# Patient Record
Sex: Female | Born: 1985 | Race: White | Hispanic: No | Marital: Single | Smoking: Current every day smoker
Health system: Southern US, Community
[De-identification: ages and names within clinical notes are randomized; demographics above are authoritative.]

## PROBLEM LIST (undated history)

## (undated) ENCOUNTER — Inpatient Hospital Stay (HOSPITAL_COMMUNITY): Payer: Self-pay

## (undated) DIAGNOSIS — N926 Irregular menstruation, unspecified: Secondary | ICD-10-CM

## (undated) DIAGNOSIS — Z8619 Personal history of other infectious and parasitic diseases: Secondary | ICD-10-CM

## (undated) DIAGNOSIS — F909 Attention-deficit hyperactivity disorder, unspecified type: Secondary | ICD-10-CM

## (undated) DIAGNOSIS — Z973 Presence of spectacles and contact lenses: Secondary | ICD-10-CM

## (undated) DIAGNOSIS — M519 Unspecified thoracic, thoracolumbar and lumbosacral intervertebral disc disorder: Secondary | ICD-10-CM

## (undated) HISTORY — PX: TUBAL LIGATION: SHX77

## (undated) HISTORY — DX: Personal history of other infectious and parasitic diseases: Z86.19

## (undated) HISTORY — DX: Unspecified thoracic, thoracolumbar and lumbosacral intervertebral disc disorder: M51.9

## (undated) HISTORY — PX: BREAST SURGERY: SHX581

---
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
## 2007-01-06 ENCOUNTER — Other Ambulatory Visit: Admission: RE | Admit: 2007-01-06 | Discharge: 2007-01-06 | Payer: Self-pay | Admitting: Obstetrics and Gynecology

## 2007-01-22 ENCOUNTER — Other Ambulatory Visit: Payer: Self-pay | Admitting: Emergency Medicine

## 2007-01-22 ENCOUNTER — Inpatient Hospital Stay (HOSPITAL_COMMUNITY): Admission: AD | Admit: 2007-01-22 | Discharge: 2007-01-24 | Payer: Self-pay | Admitting: *Deleted

## 2007-01-22 ENCOUNTER — Ambulatory Visit: Payer: Self-pay | Admitting: *Deleted

## 2007-06-27 ENCOUNTER — Emergency Department (HOSPITAL_COMMUNITY): Admission: EM | Admit: 2007-06-27 | Discharge: 2007-06-27 | Payer: Self-pay | Admitting: Emergency Medicine

## 2007-07-06 ENCOUNTER — Emergency Department (HOSPITAL_COMMUNITY): Admission: EM | Admit: 2007-07-06 | Discharge: 2007-07-06 | Payer: Self-pay | Admitting: Emergency Medicine

## 2007-07-13 ENCOUNTER — Inpatient Hospital Stay (HOSPITAL_COMMUNITY): Admission: AD | Admit: 2007-07-13 | Discharge: 2007-07-13 | Payer: Self-pay | Admitting: Obstetrics & Gynecology

## 2007-08-03 ENCOUNTER — Ambulatory Visit: Payer: Self-pay | Admitting: Obstetrics and Gynecology

## 2007-09-20 ENCOUNTER — Emergency Department (HOSPITAL_COMMUNITY): Admission: EM | Admit: 2007-09-20 | Discharge: 2007-09-20 | Payer: Self-pay | Admitting: Emergency Medicine

## 2007-10-09 ENCOUNTER — Emergency Department (HOSPITAL_COMMUNITY): Admission: EM | Admit: 2007-10-09 | Discharge: 2007-10-10 | Payer: Self-pay | Admitting: Emergency Medicine

## 2007-10-17 ENCOUNTER — Inpatient Hospital Stay (HOSPITAL_COMMUNITY): Admission: AD | Admit: 2007-10-17 | Discharge: 2007-10-17 | Payer: Self-pay | Admitting: Family Medicine

## 2007-10-22 ENCOUNTER — Emergency Department (HOSPITAL_COMMUNITY): Admission: EM | Admit: 2007-10-22 | Discharge: 2007-10-23 | Payer: Self-pay | Admitting: Emergency Medicine

## 2007-11-17 ENCOUNTER — Ambulatory Visit (HOSPITAL_COMMUNITY): Admission: RE | Admit: 2007-11-17 | Discharge: 2007-11-17 | Payer: Self-pay | Admitting: General Surgery

## 2007-11-17 ENCOUNTER — Encounter (INDEPENDENT_AMBULATORY_CARE_PROVIDER_SITE_OTHER): Payer: Self-pay | Admitting: General Surgery

## 2007-11-17 HISTORY — PX: CHOLECYSTECTOMY, LAPAROSCOPIC: SHX56

## 2008-04-12 HISTORY — PX: CHOLECYSTECTOMY: SHX55

## 2008-06-11 ENCOUNTER — Inpatient Hospital Stay (HOSPITAL_COMMUNITY): Admission: AD | Admit: 2008-06-11 | Discharge: 2008-06-11 | Payer: Self-pay | Admitting: Obstetrics and Gynecology

## 2008-07-29 ENCOUNTER — Inpatient Hospital Stay (HOSPITAL_COMMUNITY): Admission: AD | Admit: 2008-07-29 | Discharge: 2008-07-31 | Payer: Self-pay | Admitting: Obstetrics and Gynecology

## 2008-12-06 ENCOUNTER — Emergency Department (HOSPITAL_COMMUNITY): Admission: EM | Admit: 2008-12-06 | Discharge: 2008-12-06 | Payer: Self-pay | Admitting: Emergency Medicine

## 2008-12-20 ENCOUNTER — Inpatient Hospital Stay (HOSPITAL_COMMUNITY): Admission: AD | Admit: 2008-12-20 | Discharge: 2008-12-20 | Payer: Self-pay | Admitting: Family Medicine

## 2009-05-20 ENCOUNTER — Emergency Department (HOSPITAL_COMMUNITY): Admission: EM | Admit: 2009-05-20 | Discharge: 2009-05-21 | Payer: Self-pay | Admitting: Emergency Medicine

## 2009-11-17 ENCOUNTER — Emergency Department (HOSPITAL_COMMUNITY): Admission: EM | Admit: 2009-11-17 | Discharge: 2009-11-17 | Payer: Self-pay | Admitting: Emergency Medicine

## 2010-03-15 ENCOUNTER — Inpatient Hospital Stay (HOSPITAL_COMMUNITY)
Admission: AD | Admit: 2010-03-15 | Discharge: 2010-03-15 | Payer: Self-pay | Source: Home / Self Care | Admitting: Obstetrics & Gynecology

## 2010-06-22 LAB — URINE MICROSCOPIC-ADD ON

## 2010-06-22 LAB — URINALYSIS, ROUTINE W REFLEX MICROSCOPIC
Bilirubin Urine: NEGATIVE
Glucose, UA: NEGATIVE mg/dL
Ketones, ur: NEGATIVE mg/dL
Leukocytes, UA: NEGATIVE
Nitrite: NEGATIVE
Protein, ur: NEGATIVE mg/dL
Specific Gravity, Urine: 1.02 (ref 1.005–1.030)
Urobilinogen, UA: 0.2 mg/dL (ref 0.0–1.0)
pH: 6 (ref 5.0–8.0)

## 2010-06-22 LAB — POCT PREGNANCY, URINE: Preg Test, Ur: NEGATIVE

## 2010-06-26 LAB — URINALYSIS, ROUTINE W REFLEX MICROSCOPIC
Bilirubin Urine: NEGATIVE
Glucose, UA: NEGATIVE mg/dL
Hgb urine dipstick: NEGATIVE
Ketones, ur: NEGATIVE mg/dL
Nitrite: NEGATIVE
Protein, ur: NEGATIVE mg/dL
Specific Gravity, Urine: 1.01 (ref 1.005–1.030)
Urobilinogen, UA: 0.2 mg/dL (ref 0.0–1.0)
pH: 6.5 (ref 5.0–8.0)

## 2010-07-01 LAB — URINALYSIS, ROUTINE W REFLEX MICROSCOPIC
Bilirubin Urine: NEGATIVE
Glucose, UA: NEGATIVE mg/dL
Hgb urine dipstick: NEGATIVE
Ketones, ur: NEGATIVE mg/dL
Nitrite: NEGATIVE
Protein, ur: NEGATIVE mg/dL
Specific Gravity, Urine: 1.01 (ref 1.005–1.030)
Urobilinogen, UA: 0.2 mg/dL (ref 0.0–1.0)
pH: 6 (ref 5.0–8.0)

## 2010-07-01 LAB — CBC
HCT: 37.7 % (ref 36.0–46.0)
Hemoglobin: 13 g/dL (ref 12.0–15.0)
WBC: 6.3 10*3/uL (ref 4.0–10.5)

## 2010-07-01 LAB — BASIC METABOLIC PANEL
GFR calc non Af Amer: >60 mL/min (ref >60)
Glucose, Bld: 111 mg/dL — ABNORMAL HIGH (ref 70–99)
Potassium: 3.3 mEq/L — ABNORMAL LOW (ref 3.5–5.1)
Sodium: 131 mEq/L — ABNORMAL LOW (ref 135–145)

## 2010-07-01 LAB — POCT PREGNANCY, URINE: Preg Test, Ur: NEGATIVE

## 2010-07-01 LAB — DIFFERENTIAL
Eosinophils Relative: 1 % (ref 0–5)
Lymphocytes Relative: 7 % — ABNORMAL LOW (ref 12–46)
Lymphs Abs: 0.5 10*3/uL — ABNORMAL LOW (ref 0.7–4.0)
Monocytes Absolute: 0.4 10*3/uL (ref 0.1–1.0)
Monocytes Relative: 7 % (ref 3–12)

## 2010-07-17 LAB — HERPES SIMPLEX VIRUS CULTURE: Culture: NOT DETECTED

## 2010-07-17 LAB — URINALYSIS, ROUTINE W REFLEX MICROSCOPIC
Bilirubin Urine: NEGATIVE
Glucose, UA: NEGATIVE mg/dL
Ketones, ur: NEGATIVE mg/dL
pH: 7 (ref 5.0–8.0)

## 2010-07-17 LAB — WET PREP, GENITAL: Yeast Wet Prep HPF POC: NONE SEEN

## 2010-07-17 LAB — URINE MICROSCOPIC-ADD ON

## 2010-07-17 LAB — GC/CHLAMYDIA PROBE AMP, GENITAL: Chlamydia, DNA Probe: NEGATIVE

## 2010-07-22 ENCOUNTER — Inpatient Hospital Stay (HOSPITAL_COMMUNITY)
Admission: AD | Admit: 2010-07-22 | Discharge: 2010-07-22 | Disposition: A | Discharge status: Home / Self Care | Payer: Medicaid Other | Source: Ambulatory Visit | Attending: Obstetrics & Gynecology | Admitting: Obstetrics & Gynecology

## 2010-07-22 DIAGNOSIS — O209 Hemorrhage in early pregnancy, unspecified: Secondary | ICD-10-CM | POA: Insufficient documentation

## 2010-07-22 LAB — CBC
HCT: 34.5 % — ABNORMAL LOW (ref 36.0–46.0)
MCHC: 33.9 g/dL (ref 30.0–36.0)
MCHC: 34.3 g/dL (ref 30.0–36.0)
MCV: 92.5 fL (ref 78.0–100.0)
Platelets: 297 10*3/uL (ref 150–400)
Platelets: 322 10*3/uL (ref 150–400)
RDW: 13.7 % (ref 11.5–15.5)
RDW: 13.8 % (ref 11.5–15.5)
WBC: 13.6 10*3/uL — ABNORMAL HIGH (ref 4.0–10.5)

## 2010-07-22 LAB — WET PREP, GENITAL
Clue Cells Wet Prep HPF POC: NONE SEEN
Clue Cells Wet Prep HPF POC: NONE SEEN
Trich, Wet Prep: NONE SEEN
Yeast Wet Prep HPF POC: NONE SEEN

## 2010-07-22 LAB — URINALYSIS, ROUTINE W REFLEX MICROSCOPIC
Bilirubin Urine: NEGATIVE
Ketones, ur: NEGATIVE mg/dL
Nitrite: NEGATIVE
Protein, ur: NEGATIVE mg/dL
Specific Gravity, Urine: <1.005 — ABNORMAL LOW (ref 1.005–1.030)
Urobilinogen, UA: 0.2 mg/dL (ref 0.0–1.0)

## 2010-07-22 LAB — HCG, QUANTITATIVE, PREGNANCY: hCG, Beta Chain, Quant, S: 7 m[IU]/mL — ABNORMAL HIGH (ref <5)

## 2010-07-22 LAB — POCT PREGNANCY, URINE: Preg Test, Ur: NEGATIVE

## 2010-07-23 LAB — URINALYSIS, ROUTINE W REFLEX MICROSCOPIC
Bilirubin Urine: NEGATIVE
Hgb urine dipstick: NEGATIVE
Ketones, ur: NEGATIVE mg/dL
Protein, ur: NEGATIVE mg/dL
Urobilinogen, UA: 0.2 mg/dL (ref 0.0–1.0)
pH: 6.5 (ref 5.0–8.0)

## 2010-07-23 LAB — GC/CHLAMYDIA PROBE AMP, GENITAL
Chlamydia, DNA Probe: NEGATIVE
GC Probe Amp, Genital: NEGATIVE

## 2010-07-23 LAB — WET PREP, GENITAL

## 2010-07-24 ENCOUNTER — Inpatient Hospital Stay (HOSPITAL_COMMUNITY)
Admission: RE | Admit: 2010-07-24 | Discharge: 2010-07-24 | Disposition: A | Discharge status: Home / Self Care | Payer: Medicaid Other | Source: Ambulatory Visit | Attending: Obstetrics & Gynecology | Admitting: Obstetrics & Gynecology

## 2010-07-24 DIAGNOSIS — N949 Unspecified condition associated with female genital organs and menstrual cycle: Secondary | ICD-10-CM | POA: Insufficient documentation

## 2010-07-24 DIAGNOSIS — N938 Other specified abnormal uterine and vaginal bleeding: Secondary | ICD-10-CM | POA: Insufficient documentation

## 2010-08-25 NOTE — H&P (Signed)
[name], [name] NO.:  [id number]   MEDICAL RECORD NO.:  [id number]          PATIENT TYPE:  IPS   LOCATION:  0601                          FACILITY:  [facility]   PHYSICIAN:  [name], M.D. DATE OF BIRTH:  02/12/1986   DATE OF ADMISSION:  01/22/2007  DATE OF DISCHARGE:                       PSYCHIATRIC ADMISSION ASSESSMENT   IDENTIFICATION:  A 25 year old white female.  This was of voluntary  admission.  She is married.   HISTORY OF PRESENT ILLNESS:  This 25 year old was referred to the  emergency room by  Dr. Ashley Garza her OB/GYN physician after she says she  called him complaining of depression.  In the emergency room shoe voiced  some fears of possibly harming her own child, feeling overwhelmed by  their financial problems, is apparently living in the house owned by a  relative with no sink in the bathroom, having to go to the mother-in-  law's to bathe the two children, unable to make ends meet.  The car is  broken down and she feels unable to cope with these stressors.  She  reports crying daily for the past 2 weeks, unable to sleep more than 3  hours per night for the past week, but denies having any suicidal or  homicidal thought.  She does have a charge for a making harassing phone  calls which she says is due to a friend using her phone.  She is  scheduled for a court date October the 30th and has been unable so far  to make her requirement of voluntary service of 40 hours.  She reports  smoking marijuana daily until 1 week ago when she decided that it would  be better if she quit.  She denies any hallucinations.  She reports her  mother-in-law is her primary support in caring for the children and  keeping house.   PAST PSYCHIATRIC HISTORY:  The patient has no current psychiatric care.  This is her first admission to [facility] and  her first inpatient admission.  She reports being seen as an outpatient  in 2004 at the  [facility] for treatment at that time of attention  problems, making straight F's in school, was started on Adderall b.i.d.  and grades improved to A's, B's, and C's.  Later she was switched to  Wellbutrin which she said did not help her.  She denies any history of  brain injury or learning deficit.   SOCIAL HISTORY:  The patient has been married for about 1 year to her  current spouse.  She has a 63-month-old child born mid-August here in  [place] and also has a 54-year-old son in the home by another  relationship.  Currently living with her spouse and children in a home  owned by an aunt.  She endorses a lot of financial stressors, having  difficulty in making ends meet.  They are getting help and support from  the mother-in-law, does have a legal charge for making harassing phone  calls.  Denies substance abuse other than the marijuana.   FAMILY HISTORY:  Is remarkable for brother with drug  and alcohol abuse.   MEDICAL HISTORY:  The patient is followed by Dr. Ashley Garza her  obstetrician.  Medical problems:  The patient is 2 months postpartum.  Current medications are none.   PAST MEDICAL HISTORY:  Is remarkable for no history of seizures,  blackouts or memory loss, childbirth x2, denies any other  hospitalizations or surgeries.   DRUG ALLERGIES:  Are none.   POSITIVE PHYSICAL FINDINGS:  Well-nourished, well-developed female,  healthy appearing, appears to be her stated age, 5 feet tall, 126  pounds, temperature 97.6, pulse 61, respirations 16, blood pressure  109/67. Vital signs were within normal limits on presentation in the  emergency room.  PE is noted in the record and is generally  unremarkable.  Hygiene is good.  Dress is appropriate.  Chemistry,  sodium 140, potassium 3.3, chloride 105, carbon dioxide 24, and BUN six.  Creatinine pending.  Urine pregnancy test was negative.  CBC within  normal limits.  Urine drug screen positive for marijuana, alcohol level  less  than five.   MENTAL STATUS EXAM:  Fully alert female with irritable affect,  constricted.  Hygiene is good.  Dress is appropriate.  She is a  generally resistant to interview.  Speaks with reluctance, is actually  is quite insistent that she needs to leave, does not understand why she  was referred for admission.  Speech is soft, gives a very limited  history, responds in limited fashion to questions but not particularly  forthcoming with her history or circumstances.  Mood is depressed and  irritable.  Thought process is logical and coherent.  Does not appear  internally distracted, no guarding.  No paranoia.  No overly delusional  statements.  Insight is poor and judgment guarded.  Cognition is fully  preserved, memory is completely intact.  Concentration is adequate.  Impulse control within normal limits.  She is denying any suicidal or  homicidal thoughts, categorically denying that she made any such  statements about her daughter or that she has ever had any thoughts of  harming her daughter.   AXIS I:  Depressive disorder NOS.  Cannabis abuse.  AXIS II:  Deferred.  AXIS III:  No diagnosis.  AXIS IV:  Severe, issues with financial stressors and support.  Having  supportive family and supportive extended family is an asset.  AXIS V:  Current 30 past year 64 estimated.   PLAN:  Is to voluntarily admit the patient with the goal of evaluating  her mood, assisting in evaluating her home situation and providing  adequate support.  We requested a family session with her husband and we  are going to start her on Celexa 10 mg daily and Ambien 5 mg h.s. p.r.n.  insomnia and will check a routine urinalysis and a TSH.  She is signed a  72-hour request for discharge last evening and we have explained to her  our process for getting corroboration from family and ensuring  everyone's safety.   Estimated length of stay is 5 days      [name], N.P.      [name],  M.D.  Electronically Signed    MAS/MEDQ  D:  01/23/2007  T:  01/23/2007  Job:  [id number]

## 2010-08-25 NOTE — Op Note (Signed)
NAMECARMON, [name]             ACCOUNT NO.:  [id number]   MEDICAL RECORD NO.:  [id number]          PATIENT TYPE:  AMB   LOCATION:  SDS                          FACILITY:  [facility]   PHYSICIAN:  [name], MDDATE OF BIRTH:  16-Dec-1985   DATE OF PROCEDURE:  11/17/2007  DATE OF DISCHARGE:                               OPERATIVE REPORT   PREOPERATIVE DIAGNOSIS:  Symptomatic cholelithiasis.   POSTOPERATIVE DIAGNOSIS:  Symptomatic cholelithiasis.   PROCEDURE:  Laparoscopic cholecystectomy.   SURGEON:  Joanne Gosling, MD.   ASSISTANT:  Wilmon Arms. Tsuei, M.D.   ESTIMATED BLOOD LOSS:  Minimal.   ANESTHESIA:  General endotracheal.   COMPLICATIONS:  None.   DRAINS:  None.   SPECIMENS:  Gallbladder contents to pathology.   INDICATIONS:  This is a 25 year old female with a several month history  of worsening right upper quadrant pain and that this especially occurs  when eating significant as she has been taking oxycodone frequently and  only eating once a day due to this pain.  She has an ultrasound with a  1.5-cm stone at the neck of the gallbladder with the gallbladder wall  being mildly prominent throughout.  Her LFTs were normal.  She was  counseled for a laparoscopic cholecystectomy.   PROCEDURE:  After informed consent was obtained, the patient was taken  to the operating room where she was placed under general endotracheal  anesthesia without complication.  A gram of cefoxitin was administered  without complication.  Her abdomen was then prepped and draped in the  standard sterile surgical fashion. SCDs were placed on during the  operation.  A 10-mm vertical incision was made infraumbilically and  dissection was carried out down to the level of her fascia, which was  entered sharply.  Her abdomen was then entered without difficulty.  A  pursestring 0 Vicryl suture was then placed around this.  The Hasson  trocar was then inserted and the abdomen was  insufflated without any  difficulty.  She was then placed in reverse Trendelenburg position.  Further 5-mm epigastric port and 2 further right upper quadrant 5-mm  ports were placed under direct vision without complication.  The  gallbladder was then retracted cephalad and laterally and the triangle  of Calot was begun to be dissected.  There was a fair amount of scarring  present in the triangle of Calot.  There was some raw surface area  bleeding during this dissection as well that was controlled with some  electrocautery in the region on the gallbladder.  The triangle of Calot  was dissected noting that the cystic duct and the cystic artery with a  liver behind that obtained in a critical view safety.  Once this was  completed, the cystic duct was then clipped twice proximally and once  distally and this was then cut.  The clips were in good position.  Once  this was done, the cystic artery was then dissected and 3 clips were  placed in the cystic artery.  This was cut leaving 2 clips in place.  Both the cystic duct and the cystic artery  were clipped and without  drainage at the completion of this portion.  The gallbladder was then  removed from the liver bed using electrocautery without difficulty.  There was no entrance into the gallbladder during this portion.  The  clips were then looked at once more.  There was a small amount of raw  surface area bleeding from some of the adhesions that were there.  I did  leave a piece of Surgicel in place overlying these.  Irrigation was  performed.  Hemostasis was observed.  The fluid was then sucked out.  The gallbladder was then removed from the liver bed.  It was placed in  an EndoCatch and brought out through the umbilical incision while  watching with a 5-mm camera.  Upon that, the pursestring suture was tied  down.  The omentum was noted to be adherent towards the camera until the  umbilical incision.  This was pulled down with a grasper.   There was no  evidence of any hernia at this site.  The bowel and surrounding area was  inspected and there was no evidence of any injury when we had entered  into the abdomen.  All ports were then removed under direct vision after  the abdomen was desufflated.  All ports were then closed with a 4-0  Monocryl suture and dressings were applied.  She tolerated this well and  was extubated in the operating room, and transferred to PACU in stable  condition.      Joanne Gosling, MD  Electronically Signed     MCW/MEDQ  D:  11/17/2007  T:  11/18/2007  Job:  [id number]

## 2010-08-25 NOTE — Discharge Summary (Signed)
[name], [name] NO.:  [id number]   MEDICAL RECORD NO.:  [id number]          PATIENT TYPE:  INP   LOCATION:  9141                          [facility]ILITY[facility]  [facility]   PHYSICIAN:  Rudy Jew. Ashley Royalty, M.D.DATE OF BIRTH:  January 13, 1986   DATE OF ADMISSION:  11/24/2006  DATE OF DISCHARGE:  11/26/2006                               DISCHARGE SUMMARY   DISCHARGE DIAGNOSES:  1. Intrauterine pregnancy at 39 weeks 1 day gestation, delivered.  2. Term birth living child, vertex.  3. Smoker.  4. History of marijuana use.   OPERATIONS AND SPECIAL PROCEDURES:  OB delivery.   CONSULTATIONS:  None.   DISCHARGE MEDICATIONS:  1. Percocet.  2. Motrin 600 mg.   HISTORY AND PHYSICAL:  This is an 25 year old gravida 2, para 1 at  greater than [redacted] weeks gestation.  Prenatal care complicated by the  aforementioned diagnoses.  The patient was admitted for induction  secondary to the above risk factors plus advanced cervical changes.  Cervical dilatation was noted be 3 to 4 cm, 80% effacement, -1 to -2  station, vertex presentation.  For the remainder of the history and  physical, please see chart.   HOSPITAL COURSE:  The patient was admitted to the [facility].  Admission laboratory studies were drawn.  She went on to  labor and deliver on November 24, 2006.  The infant was a 7 pound 3 ounce  female, Apgar's 8 at 1 minute and 9 at 5 minutes sent to the newborn  nursery.  Delivery was accomplished by Dr. Sylvester Harder over an intact  perineum.  There were no significant lacerations.  The patient's  postpartum course was benign.  She was discharged on the second  postpartum day afebrile and in satisfactory condition.   DISPOSITION:  The patient is return to [facility]  in 4-6 weeks for postpartum evaluation.      James A. Ashley Royalty, M.D.  Electronically Signed     JAM/MEDQ  D:  12/29/2006  T:  12/29/2006  Job:  [id number]

## 2010-08-25 NOTE — Group Therapy Note (Signed)
NAMEDEA, BITTING NO.:  [id number]   MEDICAL RECORD NO.:  [id number]          PATIENT TYPE:  WOC   LOCATION:  [facility]                   FACILITY:  [facility]   PHYSICIAN:  Argentina Donovan, MD        DATE OF BIRTH:  Mar 20, 1986   DATE OF SERVICE:  08/03/2007                                  CLINIC NOTE   The patient is a 25 year old gravida 2, para 2-0-0-2 who delivered her  last baby in August at the [facility]. Has been seen several times  by [facility] emergency room as well as [facility] for referral pelvic pain.  The last time that it looked as if she probably had a ruptured ovarian  cyst as she had fluid in the cul-de-sac and no other laboratory signs of  PID. Apparently she did have a positive chlamydia as well as Trichomonas  in the past which she had been treated for but she never thought the  treatment was complete. She is concerned that her husband thinks she  still has it because he has been bothered recently. She has an IUD of  Mirena type which she is not happy with because she continues to have  bleeding and she has chronic recurrent yeast infections since she has  had that in.  She tried Depo-Provera and bled continually. She cannot  remember to take the pills, and she is thinking about a patch. She is  using condoms until then. She said she bought tons of them and is going  to go to the health department to get the patch and decide up in the  family planning.   PHYSICAL EXAMINATION:  Her abdomen is flat, soft, nontender.  No masses  or organomegaly. External genitalia is normal.  BUS within normal  limits.  Vagina is clean and well rugated.  Cervix is clean and parous.  IUD was easily removed with ring forceps. The uterus is anterior, of  normal size, shape, consistency and nontender.  The adnexa was not  tender. Cul-de-sac was free.   I told the patient we would wait for the cultures, see what they showed,  and will give her a phone call with the  results.  The other thing is  that we will see how she does since the IUD is removed as that may be  one of the causes of her pain.   IMPRESSION:  Pelvic pain, question of adequately treated chlamydia and  Trichomonas           ______________________________  Argentina Donovan, MD     PR/MEDQ  D:  08/03/2007  T:  08/03/2007  Job:  [id number]

## 2010-08-25 NOTE — H&P (Signed)
NAMEAYDIA, MAJ NO.:  [id number]   MEDICAL RECORD NO.:  [id number]          PATIENT TYPE:  INP   LOCATION:  9198                          [facility]ACILITY:  [facility]   PHYSICIAN:  Janine Limbo, M.D.DATE OF BIRTH:  Mar 04, 1986   DATE OF ADMISSION:  07/29/2008  DATE OF DISCHARGE:                              HISTORY & PHYSICAL   Ms. [name] is a 25 year old gravida 3, para 2 followed by the midwives  at [facility] who presents today with complaints of pelvic  pressure and increased vaginal discharge and contractions that are  irregular.  Her pregnancy is remarkable for:  1. ADHD and she stopped the medication after conception.  2. She had a lap chole after conception.  3. She is a smoker, smokes about a pack a day currently.  4. Has a history of GBS and current GBS.  5. History of Chlamydia, currently no chlamydia.  6. History of postpartum depression with medication.   PRENATAL LABS:  Ms. Verdie Shire prenatal labs include initial hemoglobin in  October of 12, hematocrit 35.2, platelets 232, blood type O+, antibody  negative, RPR nonreactive, rubella titer immune, hepatitis B negative,  Pap normal in March of 2009, gonorrhea and chlamydia negative in  September of 2009 and March of 2010.  First trimester screen normal.  Glucola normal, hemoglobin with the Glucola at 28 weeks was 11 and the  GBS culture was positive, on April 6 positive GBS.  Today she had a  negative Wet prep.   CURRENT MEDICATIONS:  Prenatal vitamin, Vicodin and Flexeril.   HISTORY OF PRESENT PREGNANCY:  Ms. [name] presented for her new OB  interview in the fall at approximately [redacted] weeks gestation with a history  of multiple contraception failures on pretty much every type of  contraception she had tried.  She had put a Mirena in after the last  pregnancy and then began to have stomach issues and thought it was  caused by the Mirena and then had it pulled out and then turned out she  needed her gallbladder so that was the story on that.  Then she got  pregnant days before her surgery, like less than 2 weeks before her  surgery and did not know she was pregnant at the time of the surgery.  She had a first trimester screen that was done the third week in October  which was normal.  She was treated for yeast infection by her new OB  with some Diflucan.  She was very underweight the beginning of pregnancy  and weighed 114 pounds at the beginning of the second trimester at her  new OB and stands 5 foot 4.  Her BMI was 18, we did discuss nutrition  quite a bit with her.  She was offered the H1N1 vaccine and declined.  She was heavily counseled with smoking cessation and was not able to be  successful with that.  At 19 weeks she had anatomy ultrasound that  showed size concordant with dates, estimated fetal weight 12 ounces  which was 64th percentile, an AFI of 4.2, cervix of 4.1 and a  placenta  that was normal with a three-vessel cord.  All anatomy was seen and was  normal.  At 24 weeks and she was on amoxicillin for bronchitis via her  primary care doctor and at 28 weeks she had her Glucola and a repeat  hemoglobin and her Glucola turned out to be 82, normal, and hemoglobin  was 11.  She began to have a lot of complaints of pelvic pressure at 30  weeks and was counseled in exercises for that and advised to stop  lifting and carrying her toddler.  At 32 weeks she was seen in the MAU  for pelvic pain and pressure, that was on March 3.  On March 19 she had  an ultrasound for size less than dates which showed an estimated fetal  weight of 2577 grams, cervix 4.11, AFI 15%, normal vertex presentation,  placenta posterior, negative gonorrhea and Chlamydia cultures from the  second.  The patient was given Vicodin and Flexeril for the pelvic pain  and counseled again on nutrition and smoking cessation.  She had  negative gonorrhea and Chlamydia cultures on March 10.  She had  another  ultrasound on April 15 for decreased fetal movement and had a BPP of 8  out of 8, a normal AFI and normal growth on the fetus.  Her cervix was  4.1 cm long at that time and everything was normal at that ultrasound.  She presents today with a request for induction of labor secondary to  pelvic pain and pressure and concerns about living 45 minutes away and  being positive for GBS.   OB HISTORY:  This is Ms. [name]'s third pregnancy, first pregnancy  resulted in the birth of a son in January 2006, Darien, which was an  induction of labor at 40 weeks for oligohydramnios, a spontaneous  vaginal delivery without complications D[place]en weighed 7 pounds 6 ounces.  Pregnancy #2 resulted in the birth of a daughter, Reuel Boom who was born  in August of 2008, she was delivered without complications at 40 weeks  and weighed 7 pounds 3 ounces.   ALLERGIES:  MS. [name] HAS NO OTHER ALLERGIES TO DRUGS, FOODS OR  MEDICATIONS.   MEDICAL HISTORY:  Ms. [name] has some history of postpartum depression  with the second baby, she was on medications for about 3 months.  She  has used birth control pills in the past, the patch, Depo-Provera shot  and IUD for birth control.  She had a history of abnormal Pap in 2008,  history of Chlamydia in 2005, history of yeast infections frequently,  history of positive GBS with the first pregnancy in 2006.  She had the  usual childhood illnesses including chickenpox, she has ADHD.  She is a  smoker.   SURGICAL HISTORY:  She had 2 fibroadenomas in her breasts removed in  2007 and she had a cholecystectomy in 2009.   FAMILY HISTORY:  She has a maternal grandmother with chronic  hypertension.  Her mother has varicose veins.  Her paternal grandmother  has some type of thyroid dysfunction not specified.  Her sister has  depression, her brother has panic attacks.  The patient's mother and  brother smoke cigarettes.   GENETIC HISTORY:  There is no contributory  genetic risk factors for the  patient or the father of the baby and she did have a negative first  trimester screen.   SOCIAL HISTORY:  She is married to [employer], [place] although she  has not changed her  name which is why her name is Emonii [name].  They  have been married for a couple years and she has a ninth grade education  and is a stay-at-home mom.  He has a high school diploma and works as a  Academic librarian.  They are both Caucasian.  She does not state a religious  preference.  She denies use of alcohol or street drugs during pregnancy  but has been smoking the entire pregnancy.   PHYSICAL EXAM:  Today is normal.  VITAL SIGNS:  Stable, she is afebrile.  Fetal heart rate is a normal  rate with multiple accelerations, very reactive and reassuring.  Contractions are about 12 minutes apart, there are soft resting tones  between.  They last about 90 seconds and are fairly moderate.  LUNGS:  Clear to auscultation bilaterally.  HEART:  A regular rate and rhythm without murmurs.  BREASTS:  Soft.  ABDOMEN:  Gravid and appropriate for gestational age.  She has no edema of extremities, normal deep tendon reflexes, negative  Homan's and clonus x2.  She is very tense and distractible.  Avoids eye  contact, repeatedly asking for an epidural.  Her vaginal exam was 5 cm,  80% dilated and -1.  Burn test was done which was negative.  There was  negative pooling.  There was copious mucus.  Wet prep was negative.  The  baby did have positive scalp stimulation with the vaginal exam.   IMPRESSION:  A 25 year old G3 P2-0-0-2 at [redacted] weeks gestation, positive  GBS, dilated 5 cm with very soft, favorable cervix, reassuring fetal  heart.  Lives 45 minutes from the hospital.  Admit for GBS, prophylaxis  and ambulation and proceed to artificial rupture of membranes or Pitocin  augmentation after a second dose of penicillin per protocol.  Routine  admission orders, CNM management.      Eulogio Bear, CNM      Janine Limbo, M.D.  Electronically Signed    JM/MEDQ  D:  07/29/2008  T:  07/29/2008  Job:  [id number]

## 2010-08-26 ENCOUNTER — Inpatient Hospital Stay (HOSPITAL_COMMUNITY)
Admission: AD | Admit: 2010-08-26 | Discharge: 2010-08-26 | Disposition: A | Discharge status: Home / Self Care | Payer: Medicaid Other | Source: Ambulatory Visit | Attending: Family Medicine | Admitting: Family Medicine

## 2010-08-26 ENCOUNTER — Inpatient Hospital Stay (HOSPITAL_COMMUNITY): Payer: Medicaid Other

## 2010-08-26 DIAGNOSIS — O9989 Other specified diseases and conditions complicating pregnancy, childbirth and the puerperium: Secondary | ICD-10-CM

## 2010-08-26 DIAGNOSIS — Z3687 Encounter for antenatal screening for uncertain dates: Secondary | ICD-10-CM

## 2010-08-26 DIAGNOSIS — O99891 Other specified diseases and conditions complicating pregnancy: Secondary | ICD-10-CM | POA: Insufficient documentation

## 2010-08-26 LAB — POCT PREGNANCY, URINE: Preg Test, Ur: POSITIVE

## 2010-08-28 ENCOUNTER — Inpatient Hospital Stay (HOSPITAL_COMMUNITY)
Admission: EM | Admit: 2010-08-28 | Discharge: 2010-08-28 | Disposition: A | Discharge status: Home / Self Care | Payer: Medicaid Other | Source: Ambulatory Visit | Attending: Family Medicine | Admitting: Family Medicine

## 2010-08-28 DIAGNOSIS — J029 Acute pharyngitis, unspecified: Secondary | ICD-10-CM

## 2010-08-28 DIAGNOSIS — O3680X0 Pregnancy with inconclusive fetal viability, not applicable or unspecified: Secondary | ICD-10-CM

## 2010-08-28 DIAGNOSIS — O99891 Other specified diseases and conditions complicating pregnancy: Secondary | ICD-10-CM | POA: Insufficient documentation

## 2010-08-28 DIAGNOSIS — O9989 Other specified diseases and conditions complicating pregnancy, childbirth and the puerperium: Secondary | ICD-10-CM

## 2010-08-28 LAB — HCG, QUANTITATIVE, PREGNANCY: hCG, Beta Chain, Quant, S: 6912 m[IU]/mL — ABNORMAL HIGH (ref <5)

## 2010-08-28 NOTE — Discharge Summary (Signed)
[name], [name] NO.:  [id number]   MEDICAL RECORD NO.:  [id number]          PATIENT TYPE:  IPS   LOCATION:  0601                          FACILITY:  [facility]   PHYSICIAN:  Jasmine Pang, M.D. DATE OF BIRTH:  Nov 07, 1985   DATE OF ADMISSION:  01/22/2007  DATE OF DISCHARGE:  01/24/2007                               DISCHARGE SUMMARY   IDENTIFICATION:  This is a 25 year old married white female who was  admitted on a voluntary basis on January 22, 2007.   HISTORY OF PRESENT ILLNESS:  This is a 25 year old who was referred to  the emergency room by Dr. Ashley Royalty her OB/GYN physician.  After she  called him complaining of depression.  In the emergency room, she voiced  some fears of possibly harming her own child and feeling overwhelmed by  their financial problems.  She is apparently living in the house owned  by a relative with no sink in the bathroom.  She has been having to go  to her mother-in-laws' to bathe the 2 children.  They have been unable  to make ends meet.  Her car has broken down and she feels unable to cope  with these stressors.  She reports crying daily for the past 2 weeks,  unable to sleep more than 3 hours per night for the past week.  She  denies having any suicidal or homicidal thoughts.  She does have a  charge for making harassing phone calls, which she said was due to a  friend using her phone.  She is scheduled for court February 09, 2007,  and has been unable so far to make her requirement of a voluntary  service of 40 hours.  She reports smoking marijuana daily until one week  ago and she decided it would be better if she quit.  She denies any  hallucinations.  She reports her mother-in-law is her primary support  and caring for the children and keeping house.  The patient has no  current psychiatric care.  This is her [facility] admission to [facility] and her first inpatient admission.  She reports  being seen as  an outpatient in 2004, at the [facility] for  treatment at the time of attention problems.  She was started on  Adderall and there was a marked improvement in grades, later she was  switched to Wellbutrin, which she states did not help her.  She denied  any history of brain injury or learning deficits.  She has a brother  with drug and alcohol abuse.  The patient is 2 months postpartum.  She  is on no medications.  She has no known drug allergies.   PHYSICAL FINDINGS:  Upon admission, the patient was a healthy, well-  developed female who was in no acute medical or physical distress.   ADMISSION LABORATORIES:  Chemistries revealed a sodium of 1.40,  potassium of 3.3, chloride of 105.  Carbon dioxide 24 and BUN 6.  Urine  pregnancy test was negative.  CBC was within normal limits.  Urine drug  screen was positive for  marijuana.  Alcohol level was less than 5.  Urinalysis was positive for 7-10 wbc's per high-powered field, but  otherwise negative.  TSH was within normal limits.   HOSPITAL COURSE:  Upon admission, the patient was continued on Ativan 2  mg p.o. tonight q.h.s., may repeat x1 dose.  On January 23, 2007, Ativan  was discontinued.  She was placed on Seroquel 25 mg p.o. q. 4h. p.r.n.  agitation.  She was placed on Celexa 10 mg daily.  She was placed on  Ambien 5 mg p.o. q.h.s. p.r.n. may repeat x1.  The patient tolerated  these medications well with no significant side effects.  Initially, the  patient was tearful, depressed, sullen, and reserved in individual  sessions with me.  She was reluctant to participate in unit therapeutic  groups and activities.  She was irritated because she says the ED told  us she was going to shake her baby.  She discussed her children and her  husband, she states she and her husband are having financial problems  and problems with their living situation and car.  She also has a court  date February 09, 2007, for harassment.  [facility]  D[facility] was called  to alert them that she had reportedly made threats towards her baby.  As  hospitalization progressed, she reported feeling much better.  She was  sleeping better.  She had a bright, appropriate affect.  Her mood was  euthymic.  There was no suicidal or homicidal ideation.  No thoughts of  self-injurious behavior.  No thoughts of harming her child.  No auditory  or visual hallucinations.  No paranoia or delusions.  Thoughts were  logical and goal-directed.  Thought content no predominant theme.  Cognitive was grossly back to baseline.  The patient was felt safe to be  discharged on January 24, 2007.   DISCHARGE DIAGNOSES:  AXIS I:  Depressive disorder not otherwise  specified.  Cannabis abuse.  AXIS II:  None.  AXIS III:  No diagnosis.  AXIS IV:  Severe (issues with financial stressors and support, having  supportive family, and supportive extended family as an asset.  AXIS V:  Global assessment of functioning is 50 upon discharge.  Global  assessment of functioning was 30 upon admission.  Global assessment of  functioning highest past year 24.   DISCHARGE/PLAN:  There were no specific activity level or dietary  restrictions.   POST HOSPITAL CARE PLANS:  The patient will go the [facility] for  intake appointment on February 07, 2007, to 3:30 p.m.   DISCHARGE MEDICATIONS:  Celexa 10 mg daily.      Jasmine Pang, M.D.  Electronically Signed     BHS/MEDQ  D:  02/20/2007  T:  02/21/2007  Job:  [id number]

## 2010-08-28 NOTE — Op Note (Signed)
[name], [name]             ACCOUNT NO.:  [id number]   MEDICAL RECORD NO.:  [id number]          PATIENT TYPE:  INP   LOCATION:  9117                          [facility]ILITY[facility]  [facility]   PHYSICIAN:  Charles A. Delcambre, MDDATE OF BIRTH:  May 26, 1985   DATE OF PROCEDURE:  05/07/2004  DATE OF DISCHARGE:                                 OPERATIVE REPORT   DELIVERY NOTE:  This patient was admitted per Dr. Ashley Royalty for  oligohydramnios at 38-1/2 weeks, induced.  She did very well with induction  and had a reactive strip throughout her labor.  Was induced with Pitocin up  to 24 milli-international units.  When I was checked out to take over her  care, she was 7-8 cm per R.N. at 2015 after 4-5 cm at 5:20 when Dr. Ashley Royalty  checked out to me.  She had IUPC after artificial rupture of membranes by  Dr. Ashley Royalty earlier in the day.  She progressed to complete, complete, +3  about 2215, my examination.  Everything was going well, and she  pushed to  complete, complete, +3 at 2215.  At that point she refused further pushing.  She was allowed to labor down and rest for 15-20 minutes and did well,  pushing for two more contractions thereafter to delivery.  She had  spontaneous vaginal delivery of a vigorous female, Apgars 8 and 9, cord  arterial blood gas 7.24, venous blood gas 7.37.  The placenta was  spontaneous, three-vessel, intact, sent to pathology.  Estimated blood loss  400 mL.  A first degree perineal laceration was repaired, with bilateral  labial lacerations repaired with 3-0 chromic under local and epidural.  Mother and baby are resting comfortably at time and stable.      CAD/MEDQ  D:  05/07/2004  T:  05/08/2004  Job:  [id number]

## 2010-08-28 NOTE — Op Note (Signed)
[name], [name]             ACCOUNT NO.:  [id number]   MEDICAL RECORD NO.:  [id number]          PATIENT TYPE:  AMB   LOCATION:  DSC                          FACILITY:  [facility]   PHYSICIAN:  Leonie Man, M.D.   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   PROCEDURE:  Excision fibroadenoma to left breast.   SURGEON:  Leonie Man, M.D.   ASSISTANT:  OR nurse.   ANESTHESIA:  General anesthesia.   SPECIMENS:  Breast masses x2.   ESTIMATED BLOOD LOSS:  Minimal.   COMPLICATIONS:  None.   Patient taken to PACU in good condition.   The patient is a 25 year old female presenting with a very large  fibroadenoma of the left breast __________ approximately 4.5 cm in the upper  outer quadrant of the left breast and is also in the upper inner quadrant of  the breast, another smaller mass measuring approximately 2.5 cm in size.  Patient comes to the operating room now after risks and potential benefits  of surgery had been discussed.  All questions answered and consent obtained.  Discussion with her mother also carried out.   DESCRIPTION OF PROCEDURE:  Following the induction of satisfactory general  anesthesia, the patient was positioned supinely.  The left breast was  prepped and draped to be included in a sterile operative field.  Both masses  were approached by a circumareolar incision made and deepened through the  skin and subcutaneous tissues raising a flap superiorly and laterally to the  upper outer quadrant where the larger of the two masses were located and  dissection was carried down to the capsule of the mass and the mass was  dissected free in all directions and removed and forwarded for pathologic  evaluation.  The hemostasis was obtained without the cavity.   Dissection was  then carried out in the upper inner quadrant of the mass and dissecting down  to the mass in the upper inner quadrant of the breast and this was also  dissected free in its entirety and removed and forwarded for pathologic  evaluation.  Hemostasis was obtained with electrocautery. Sponge, instrument  and sharp counts were verified and subcutaneous  tissues closed with interrupted 3-0 Vicryl sutures.  Skin closed with 5-0  Monocryl suture and then reinforced with Steri-Strips.  Sterile dressings  applied.  Anesthetic reversed.  Patient removed from the operating room to  the recovery room in stable condition.  She tolerated the procedure well.      Leonie Man, M.D.  Electronically Signed     PB/MEDQ  D:  05/13/2005  T:  05/13/2005  Job:  [id number]

## 2010-09-04 ENCOUNTER — Ambulatory Visit (HOSPITAL_COMMUNITY)
Admit: 2010-09-04 | Discharge: 2010-09-04 | Disposition: A | Discharge status: Home / Self Care | Payer: Medicaid Other | Attending: Family Medicine | Admitting: Family Medicine

## 2010-09-04 ENCOUNTER — Other Ambulatory Visit (HOSPITAL_COMMUNITY): Payer: Self-pay

## 2010-09-04 ENCOUNTER — Inpatient Hospital Stay (HOSPITAL_COMMUNITY)
Admission: AD | Admit: 2010-09-04 | Discharge: 2010-09-04 | Disposition: A | Discharge status: Home / Self Care | Payer: Medicaid Other | Source: Ambulatory Visit | Attending: Obstetrics & Gynecology | Admitting: Obstetrics & Gynecology

## 2010-09-04 DIAGNOSIS — O9989 Other specified diseases and conditions complicating pregnancy, childbirth and the puerperium: Secondary | ICD-10-CM

## 2010-09-04 DIAGNOSIS — O99330 Smoking (tobacco) complicating pregnancy, unspecified trimester: Secondary | ICD-10-CM | POA: Insufficient documentation

## 2010-09-04 DIAGNOSIS — Z3689 Encounter for other specified antenatal screening: Secondary | ICD-10-CM | POA: Insufficient documentation

## 2010-09-04 DIAGNOSIS — O99891 Other specified diseases and conditions complicating pregnancy: Secondary | ICD-10-CM | POA: Insufficient documentation

## 2010-11-16 LAB — RPR: RPR: NONREACTIVE

## 2011-01-04 LAB — DIFFERENTIAL
Basophils Absolute: 0
Basophils Relative: 1
Basophils Relative: 1
Eosinophils Absolute: 0.2
Eosinophils Absolute: 0.3
Eosinophils Relative: 4
Lymphs Abs: 1.4
Monocytes Absolute: 0.3
Monocytes Absolute: 0.4
Monocytes Relative: 5
Monocytes Relative: 7
Neutro Abs: 3.5
Neutrophils Relative %: 62
Neutrophils Relative %: 63

## 2011-01-04 LAB — CBC
HCT: 38.4
Hemoglobin: 13
Hemoglobin: 13.3
RBC: 4.08
RBC: 4.16
RDW: 13.7
RDW: 14.1
WBC: 5.6

## 2011-01-04 LAB — URINALYSIS, ROUTINE W REFLEX MICROSCOPIC
Bilirubin Urine: NEGATIVE
Bilirubin Urine: NEGATIVE
Glucose, UA: NEGATIVE
Hgb urine dipstick: NEGATIVE
Ketones, ur: NEGATIVE
Ketones, ur: NEGATIVE
Nitrite: NEGATIVE
Protein, ur: NEGATIVE
Urobilinogen, UA: 0.2
Urobilinogen, UA: 0.2

## 2011-01-04 LAB — COMPREHENSIVE METABOLIC PANEL
ALT: 11
ALT: 19
AST: 33
Albumin: 3.8
Alkaline Phosphatase: 73
Alkaline Phosphatase: 76
BUN: 8
Chloride: 106
GFR calc Af Amer: >60
Glucose, Bld: 94
Glucose, Bld: 95
Potassium: 3.9
Potassium: 4.4
Sodium: 139
Sodium: 139
Total Bilirubin: 0.5
Total Protein: 6.5
Total Protein: 6.9

## 2011-01-04 LAB — URINE MICROSCOPIC-ADD ON

## 2011-01-04 LAB — POCT PREGNANCY, URINE
Operator id: 285841
Operator id: 29452
Preg Test, Ur: NEGATIVE
Preg Test, Ur: NEGATIVE

## 2011-01-05 LAB — URINALYSIS, ROUTINE W REFLEX MICROSCOPIC
Glucose, UA: NEGATIVE
Hgb urine dipstick: NEGATIVE
Ketones, ur: NEGATIVE
Protein, ur: NEGATIVE
Urobilinogen, UA: 0.2

## 2011-01-05 LAB — WET PREP, GENITAL
Clue Cells Wet Prep HPF POC: NONE SEEN
Yeast Wet Prep HPF POC: NONE SEEN

## 2011-01-05 LAB — POCT PREGNANCY, URINE
Operator id: 11897
Preg Test, Ur: NEGATIVE

## 2011-01-05 LAB — CBC
Platelets: 300
RBC: 4.28
WBC: 8.5

## 2011-01-05 LAB — GC/CHLAMYDIA PROBE AMP, GENITAL
Chlamydia, DNA Probe: POSITIVE — AB
GC Probe Amp, Genital: NEGATIVE

## 2011-01-05 LAB — URINE MICROSCOPIC-ADD ON

## 2011-01-07 LAB — D-DIMER, QUANTITATIVE: D-Dimer, Quant: 0.33

## 2011-01-07 LAB — URINALYSIS, ROUTINE W REFLEX MICROSCOPIC
Bilirubin Urine: NEGATIVE
Bilirubin Urine: NEGATIVE
Bilirubin Urine: NEGATIVE
Glucose, UA: NEGATIVE
Glucose, UA: NEGATIVE
Glucose, UA: NEGATIVE
Hgb urine dipstick: NEGATIVE
Ketones, ur: NEGATIVE
Nitrite: NEGATIVE
Nitrite: NEGATIVE
Protein, ur: NEGATIVE
Protein, ur: NEGATIVE
Specific Gravity, Urine: 1.026
Specific Gravity, Urine: 1.014
Specific Gravity, Urine: 1.015
pH: 6
pH: 6.5
pH: 7.5

## 2011-01-07 LAB — POCT I-STAT, CHEM 8
BUN: 8
Chloride: 102
Potassium: 4
Sodium: 140

## 2011-01-07 LAB — COMPREHENSIVE METABOLIC PANEL
ALT: 25
Albumin: 3.9
Alkaline Phosphatase: 57
Potassium: 3.5
Sodium: 137
Total Protein: 6.6

## 2011-01-07 LAB — GC/CHLAMYDIA PROBE AMP, GENITAL
Chlamydia, DNA Probe: NEGATIVE
Chlamydia, DNA Probe: NEGATIVE
GC Probe Amp, Genital: NEGATIVE

## 2011-01-07 LAB — WET PREP, GENITAL: Yeast Wet Prep HPF POC: NONE SEEN

## 2011-01-07 LAB — DIFFERENTIAL
Basophils Relative: 1
Eosinophils Absolute: 0.2
Lymphocytes Relative: 15
Lymphs Abs: 1
Monocytes Absolute: 0.5
Monocytes Relative: 5
Monocytes Relative: 5
Neutrophils Relative %: 78 — ABNORMAL HIGH

## 2011-01-07 LAB — CBC
Hemoglobin: 12.5
Platelets: 189
Platelets: 295
RBC: 3.86 — ABNORMAL LOW
RDW: 13.1
WBC: 6.4

## 2011-01-07 LAB — URINE CULTURE

## 2011-01-07 LAB — URINE MICROSCOPIC-ADD ON

## 2011-01-07 LAB — AMYLASE: Amylase: 103

## 2011-01-07 LAB — POCT PREGNANCY, URINE
Operator id: 277751
Operator id: 11897
Operator id: 277751
Preg Test, Ur: NEGATIVE

## 2011-01-08 LAB — DIFFERENTIAL
Basophils Absolute: 0
Basophils Relative: 0
Eosinophils Absolute: 0.2
Eosinophils Relative: 3
Lymphocytes Relative: 27
Lymphs Abs: 1.8
Neutro Abs: 4.3

## 2011-01-08 LAB — COMPREHENSIVE METABOLIC PANEL
AST: 22
Albumin: 4.4
CO2: 25
Calcium: 9.6
Creatinine, Ser: 0.68
GFR calc Af Amer: >60
GFR calc non Af Amer: >60

## 2011-01-08 LAB — CBC
MCHC: 33.8
MCV: 93.9
Platelets: 271

## 2011-01-21 LAB — URINE MICROSCOPIC-ADD ON

## 2011-01-21 LAB — I-STAT 8, (EC8 V) (CONVERTED LAB)
Bicarbonate: 22.8
Glucose, Bld: 106 — ABNORMAL HIGH
Hemoglobin: 13.9
Operator id: 257131
Sodium: 140
TCO2: 24

## 2011-01-21 LAB — URINALYSIS, ROUTINE W REFLEX MICROSCOPIC
Bilirubin Urine: NEGATIVE
Nitrite: NEGATIVE
Specific Gravity, Urine: 1.007
pH: 6.5

## 2011-01-21 LAB — POCT PREGNANCY, URINE
Operator id: 257131
Preg Test, Ur: NEGATIVE

## 2011-01-21 LAB — CBC
Platelets: 378
RDW: 14.3 — ABNORMAL HIGH

## 2011-01-21 LAB — DIFFERENTIAL
Basophils Absolute: 0.1
Eosinophils Relative: 3
Lymphocytes Relative: 28
Neutro Abs: 4.2
Neutrophils Relative %: 62

## 2011-01-21 LAB — TSH: TSH: 0.878

## 2011-01-21 LAB — RAPID URINE DRUG SCREEN, HOSP PERFORMED
Amphetamines: NOT DETECTED
Benzodiazepines: NOT DETECTED
Cocaine: NOT DETECTED
Tetrahydrocannabinol: POSITIVE — AB

## 2011-01-21 LAB — ETHANOL: Alcohol, Ethyl (B): <5

## 2011-01-22 LAB — CBC
MCHC: 34.3
RBC: 3.25 — ABNORMAL LOW
RDW: 12.9

## 2011-01-25 LAB — RPR: RPR Ser Ql: NONREACTIVE

## 2011-01-25 LAB — TYPE AND SCREEN: ABO/RH(D): O POS

## 2011-01-25 LAB — CBC
RBC: 3.51 — ABNORMAL LOW
WBC: 13.1 — ABNORMAL HIGH

## 2011-01-25 LAB — ABO/RH: ABO/RH(D): O POS

## 2011-04-13 NOTE — L&D Delivery Note (Signed)
Delivery Note At 3:10 PM a healthy female was delivered via Vaginal, Spontaneous Delivery (Presentation: ;  ).  APGAR: 8, 9; weight pending.  Placenta status: Intact, Spontaneous.  Cord: 3 vessels with the following complications: None  Anesthesia: Epidural  Episiotomy: None Lacerations: Labial ( right abrasion) Suture Repair: 3.0 vicryl rapide Est. Blood Loss (mL): 350  Mom to postpartum.  Baby to nursery-stable.  Oliver Pila 04/21/2011, 3:34 PM

## 2011-04-14 ENCOUNTER — Telehealth (HOSPITAL_COMMUNITY): Payer: Self-pay | Admitting: *Deleted

## 2011-04-14 ENCOUNTER — Encounter (HOSPITAL_COMMUNITY): Payer: Self-pay | Admitting: *Deleted

## 2011-04-14 NOTE — Telephone Encounter (Signed)
Preadmission screen  

## 2011-04-20 ENCOUNTER — Other Ambulatory Visit: Payer: Self-pay | Admitting: Obstetrics and Gynecology

## 2011-04-21 ENCOUNTER — Encounter (HOSPITAL_COMMUNITY): Payer: Self-pay

## 2011-04-21 ENCOUNTER — Inpatient Hospital Stay (HOSPITAL_COMMUNITY): Payer: Medicaid Other | Admitting: Anesthesiology

## 2011-04-21 ENCOUNTER — Encounter (HOSPITAL_COMMUNITY): Payer: Self-pay | Admitting: Anesthesiology

## 2011-04-21 ENCOUNTER — Inpatient Hospital Stay (HOSPITAL_COMMUNITY)
Admission: RE | Admit: 2011-04-21 | Discharge: 2011-04-23 | DRG: 775 | Disposition: A | Discharge status: Home / Self Care | Payer: Medicaid Other | Source: Ambulatory Visit | Attending: Obstetrics and Gynecology | Admitting: Obstetrics and Gynecology

## 2011-04-21 DIAGNOSIS — Z22330 Carrier of Group B streptococcus: Secondary | ICD-10-CM

## 2011-04-21 DIAGNOSIS — O99892 Other specified diseases and conditions complicating childbirth: Secondary | ICD-10-CM | POA: Diagnosis present

## 2011-04-21 LAB — CBC
Platelets: 371 10*3/uL (ref 150–400)
RDW: 13.3 % (ref 11.5–15.5)
WBC: 11.1 10*3/uL — ABNORMAL HIGH (ref 4.0–10.5)

## 2011-04-21 MED ORDER — ONDANSETRON HCL 4 MG PO TABS: 4.0000 mg | ORAL_TABLET | ORAL | Status: DC | PRN

## 2011-04-21 MED ORDER — LACTATED RINGERS IV SOLN
500.0000 mL | INTRAVENOUS | Status: DC | PRN
  Administered 2011-04-21: 500 mL via INTRAVENOUS

## 2011-04-21 MED ORDER — PHENYLEPHRINE 40 MCG/ML (10ML) SYRINGE FOR IV PUSH (FOR BLOOD PRESSURE SUPPORT): 80.0000 ug | PREFILLED_SYRINGE | INTRAVENOUS | Status: DC | PRN

## 2011-04-21 MED ORDER — FENTANYL 2.5 MCG/ML BUPIVACAINE 1/10 % EPIDURAL INFUSION (WH - ANES)
INTRAMUSCULAR | Status: DC | PRN
  Administered 2011-04-21: 14 mL/h via EPIDURAL

## 2011-04-21 MED ORDER — ONDANSETRON HCL 4 MG/2ML IJ SOLN: 4.0000 mg | INTRAMUSCULAR | Status: DC | PRN

## 2011-04-21 MED ORDER — LACTATED RINGERS IV SOLN
INTRAVENOUS | Status: DC
  Administered 2011-04-21 (×2): via INTRAVENOUS

## 2011-04-21 MED ORDER — ZOLPIDEM TARTRATE 5 MG PO TABS: 5.0000 mg | ORAL_TABLET | Freq: Every evening | ORAL | Status: DC | PRN

## 2011-04-21 MED ORDER — WITCH HAZEL-GLYCERIN EX PADS
1.0000 "application " | MEDICATED_PAD | CUTANEOUS | Status: DC | PRN
  Administered 2011-04-22: 1 via TOPICAL

## 2011-04-21 MED ORDER — IBUPROFEN 600 MG PO TABS: 600.0000 mg | ORAL_TABLET | Freq: Four times a day (QID) | ORAL | Status: DC | PRN

## 2011-04-21 MED ORDER — PRENATAL MULTIVITAMIN CH
1.0000 | ORAL_TABLET | Freq: Every day | ORAL | Status: DC
  Administered 2011-04-22: 1 via ORAL
  Filled 2011-04-21 (×2): qty 1

## 2011-04-21 MED ORDER — SENNOSIDES-DOCUSATE SODIUM 8.6-50 MG PO TABS
2.0000 | ORAL_TABLET | Freq: Every day | ORAL | Status: DC
  Administered 2011-04-22: 2 via ORAL

## 2011-04-21 MED ORDER — EPHEDRINE 5 MG/ML INJ
10.0000 mg | INTRAVENOUS | Status: DC | PRN
  Filled 2011-04-21: qty 4

## 2011-04-21 MED ORDER — CITRIC ACID-SODIUM CITRATE 334-500 MG/5ML PO SOLN: 30.0000 mL | ORAL | Status: DC | PRN

## 2011-04-21 MED ORDER — LIDOCAINE HCL 1.5 % IJ SOLN
INTRAMUSCULAR | Status: DC | PRN
  Administered 2011-04-21 (×2): 4 mL via EPIDURAL

## 2011-04-21 MED ORDER — DIPHENHYDRAMINE HCL 25 MG PO CAPS: 25.0000 mg | ORAL_CAPSULE | Freq: Four times a day (QID) | ORAL | Status: DC | PRN

## 2011-04-21 MED ORDER — OXYTOCIN 20 UNITS IN LACTATED RINGERS INFUSION - SIMPLE
1.0000 m[IU]/min | INTRAVENOUS | Status: DC
  Administered 2011-04-21: 2 m[IU]/min via INTRAVENOUS
  Filled 2011-04-21: qty 1000

## 2011-04-21 MED ORDER — LANOLIN HYDROUS EX OINT: TOPICAL_OINTMENT | CUTANEOUS | Status: DC | PRN

## 2011-04-21 MED ORDER — BENZOCAINE-MENTHOL 20-0.5 % EX AERO
1.0000 "application " | INHALATION_SPRAY | CUTANEOUS | Status: DC | PRN
  Administered 2011-04-21: 1 via TOPICAL

## 2011-04-21 MED ORDER — FENTANYL 2.5 MCG/ML BUPIVACAINE 1/10 % EPIDURAL INFUSION (WH - ANES)
14.0000 mL/h | INTRAMUSCULAR | Status: DC
  Filled 2011-04-21 (×2): qty 60

## 2011-04-21 MED ORDER — PENICILLIN G POTASSIUM 5000000 UNITS IJ SOLR
5.0000 10*6.[IU] | Freq: Once | INTRAVENOUS | Status: AC
  Administered 2011-04-21: 5 10*6.[IU] via INTRAVENOUS
  Filled 2011-04-21: qty 5

## 2011-04-21 MED ORDER — EPHEDRINE 5 MG/ML INJ: 10.0000 mg | INTRAVENOUS | Status: DC | PRN

## 2011-04-21 MED ORDER — BENZOCAINE-MENTHOL 20-0.5 % EX AERO
INHALATION_SPRAY | CUTANEOUS | Status: AC
  Administered 2011-04-21: 1 via TOPICAL
  Filled 2011-04-21: qty 56

## 2011-04-21 MED ORDER — OXYCODONE-ACETAMINOPHEN 5-325 MG PO TABS: 2.0000 | ORAL_TABLET | ORAL | Status: DC | PRN

## 2011-04-21 MED ORDER — OXYTOCIN 20 UNITS IN LACTATED RINGERS INFUSION - SIMPLE
125.0000 mL/h | Freq: Once | INTRAVENOUS | Status: AC
  Administered 2011-04-21: 125 mL/h via INTRAVENOUS

## 2011-04-21 MED ORDER — FLEET ENEMA 7-19 GM/118ML RE ENEM: 1.0000 | ENEMA | RECTAL | Status: DC | PRN

## 2011-04-21 MED ORDER — LIDOCAINE HCL (PF) 1 % IJ SOLN
30.0000 mL | INTRAMUSCULAR | Status: DC | PRN
  Filled 2011-04-21: qty 30

## 2011-04-21 MED ORDER — TETANUS-DIPHTH-ACELL PERTUSSIS 5-2.5-18.5 LF-MCG/0.5 IM SUSP: 0.5000 mL | Freq: Once | INTRAMUSCULAR | Status: DC

## 2011-04-21 MED ORDER — DEXTROSE 5 % IV SOLN
2.5000 10*6.[IU] | INTRAVENOUS | Status: DC
  Administered 2011-04-21: 2.5 10*6.[IU] via INTRAVENOUS
  Filled 2011-04-21 (×5): qty 2.5

## 2011-04-21 MED ORDER — TERBUTALINE SULFATE 1 MG/ML IJ SOLN: 0.2500 mg | Freq: Once | INTRAMUSCULAR | Status: DC | PRN

## 2011-04-21 MED ORDER — OXYCODONE-ACETAMINOPHEN 5-325 MG PO TABS
1.0000 | ORAL_TABLET | ORAL | Status: DC | PRN
  Administered 2011-04-22: 0.5 via ORAL
  Filled 2011-04-21: qty 1

## 2011-04-21 MED ORDER — DIBUCAINE 1 % RE OINT: 1.0000 "application " | TOPICAL_OINTMENT | RECTAL | Status: DC | PRN

## 2011-04-21 MED ORDER — SIMETHICONE 80 MG PO CHEW: 80.0000 mg | CHEWABLE_TABLET | ORAL | Status: DC | PRN

## 2011-04-21 MED ORDER — ONDANSETRON HCL 4 MG/2ML IJ SOLN: 4.0000 mg | Freq: Four times a day (QID) | INTRAMUSCULAR | Status: DC | PRN

## 2011-04-21 MED ORDER — DIPHENHYDRAMINE HCL 50 MG/ML IJ SOLN: 12.5000 mg | INTRAMUSCULAR | Status: DC | PRN

## 2011-04-21 MED ORDER — LACTATED RINGERS IV SOLN: 500.0000 mL | Freq: Once | INTRAVENOUS | Status: DC

## 2011-04-21 MED ORDER — OXYTOCIN BOLUS FROM INFUSION
500.0000 mL | Freq: Once | INTRAVENOUS | Status: DC
  Filled 2011-04-21: qty 500

## 2011-04-21 MED ORDER — PHENYLEPHRINE 40 MCG/ML (10ML) SYRINGE FOR IV PUSH (FOR BLOOD PRESSURE SUPPORT)
80.0000 ug | PREFILLED_SYRINGE | INTRAVENOUS | Status: DC | PRN
  Filled 2011-04-21: qty 5

## 2011-04-21 MED ORDER — IBUPROFEN 600 MG PO TABS
600.0000 mg | ORAL_TABLET | Freq: Four times a day (QID) | ORAL | Status: DC
  Administered 2011-04-21 – 2011-04-23 (×7): 600 mg via ORAL
  Filled 2011-04-21 (×7): qty 1

## 2011-04-21 MED ORDER — ACETAMINOPHEN 325 MG PO TABS: 650.0000 mg | ORAL_TABLET | ORAL | Status: DC | PRN

## 2011-04-21 NOTE — Progress Notes (Signed)
   Subjective: Pt comfortable with epidural  Objective: BP 120/77  Pulse 79  Temp(Src) 98.7 F (37.1 C) (Oral)  Resp 20  Ht 5\' 4"  (1.626 m)  Wt 70.761 kg (156 lb)  BMI 26.78 kg/m2  SpO2 100%      FHT:  FHR: 120 bpm, variability: moderate,  accelerations:  Present,  decelerations:  Absent UC:   regular, every 2-3 minutes SVE:   Dilation: 8 Effacement (%): 100 Station: 0 Exam by:: Dr. Senaida Ores  Labs: Lab Results  Component Value Date   WBC 11.1* 04/21/2011   HGB 10.3* 04/21/2011   HCT 31.0* 04/21/2011   MCV 91.2 04/21/2011   PLT 371 04/21/2011    Assessment / Plan: Anticipate delivery in next hour  [name] W 04/21/2011, 1:18 PM

## 2011-04-21 NOTE — H&P (Signed)
[name] is a 26 y.o. female [phone] at 39+weeks (EDD 04/26/11 by 15 week [place])  presenting for induction given term status and favorable cervix.  Prenatal care significant for pt smoking--cut down with pregnancy.  Also GBS positive.  Prenatal care began late at 17 weeks.  History OB History    Grav Para Term Preterm Abortions TAB SAB Ect Mult Living   5 3 3  1  1   3     2006 NSVD 7#6oz 2008 NSVD 7#13oz 2010 NSVD 7#6oz SAB x 1 2012  Past Medical History  Diagnosis Date  . History of chicken pox   . History of chlamydia    Past Surgical History  Procedure Date  . Breast surgery     2 llumps remosved in 2007  . Cholecystectomy 2010   Family History: family history includes Cancer in her paternal aunt; Diabetes in her father, maternal grandmother, and paternal grandmother; and Hypertension in her maternal grandmother and paternal grandmother. Social History:  reports that she has been smoking.  She has never used smokeless tobacco. She reports that she does not drink alcohol or use illicit drugs.  ROS  Dilation: 3 Effacement (%): 50 Station: -2 Exam by:: Dr. Senaida Ores AROM clear  Blood pressure 104/64, pulse 78, temperature 97.1 F (36.2 C), temperature source Oral, resp. rate 20, height 5\' 4"  (1.626 m), weight 70.761 kg (156 lb). Maternal Exam:  Uterine Assessment: Contraction strength is mild.  Contraction frequency is irregular.   Abdomen: Patient reports no abdominal tenderness. Fetal presentation: vertex  Introitus: Normal vulva. Normal vagina.    Fetal Exam Fetal Monitor Review: Variability: moderate (6-25 bpm).   Pattern: accelerations present.    Fetal State Assessment: Category I - tracings are normal.     Physical Exam  Constitutional: She is oriented to person, place, and time. She appears well-developed and well-nourished.  Cardiovascular: Normal rate and regular rhythm.   Respiratory: Effort normal and breath sounds normal.  GI: Soft. Bowel  sounds are normal.  Genitourinary: Vagina normal and uterus normal.       Cervix 50/3/-2  Neurological: She is alert and oriented to person, place, and time.  Psychiatric: She has a normal mood and affect.    Prenatal labs: ABO, Rh:  O positive Antibody:  negative Rubella: Immune (08/06 0000) RPR: Nonreactive (08/06 0000)  HBsAg: Negative (08/06 0000)  HIV: Non-reactive (08/06 0000)  GBS: Positive (12/20 0000)  One hour glucola 119 Quad screen negative Assessment/Plan: Pitocin induction.  Pt on PCN for +GBS.  Plans epidural.   Huel Cote W 04/21/2011, 9:15 AM

## 2011-04-21 NOTE — Anesthesia Preprocedure Evaluation (Signed)
Anesthesia Evaluation  Patient identified by MRN, date of birth, ID band Patient awake    Reviewed: Allergy & Precautions, H&P , Patient's Chart, lab work & pertinent test results  Airway Mallampati: III TM Distance: >3 FB Neck ROM: full    Dental No notable dental hx. (+) Teeth Intact   Pulmonary neg pulmonary ROS,  clear to auscultation  Pulmonary exam normal       Cardiovascular neg cardio ROS regular Normal    Neuro/Psych Negative Neurological ROS  Negative Psych ROS   GI/Hepatic negative GI ROS, Neg liver ROS,   Endo/Other  Negative Endocrine ROS  Renal/GU negative Renal ROS  Genitourinary negative   Musculoskeletal   Abdominal Normal abdominal exam  (+)   Peds  Hematology negative hematology ROS (+)   Anesthesia Other Findings   Reproductive/Obstetrics (+) Pregnancy                           Anesthesia Physical Anesthesia Plan  ASA: II  Anesthesia Plan: Epidural   Post-op Pain Management:    Induction:   Airway Management Planned:   Additional Equipment:   Intra-op Plan:   Post-operative Plan:   Informed Consent: I have reviewed the patients History and Physical, chart, labs and discussed the procedure including the risks, benefits and alternatives for the proposed anesthesia with the patient or authorized representative who has indicated his/her understanding and acceptance.     Plan Discussed with: Anesthesiologist and Surgeon  Anesthesia Plan Comments:         Anesthesia Quick Evaluation  

## 2011-04-21 NOTE — Anesthesia Procedure Notes (Signed)
Epidural Patient location during procedure: OB Start time: 04/21/2011 12:39 PM  Staffing Anesthesiologist: Enjoli Tidd A. Performed by: anesthesiologist   Preanesthetic Checklist Completed: patient identified, site marked, surgical consent, pre-op evaluation, timeout performed, IV checked, risks and benefits discussed and monitors and equipment checked  Epidural Patient position: sitting Prep: site prepped and draped and DuraPrep Patient monitoring: continuous pulse ox and blood pressure Approach: midline Injection technique: LOR air  Needle:  Needle type: Tuohy  Needle gauge: 17 G Needle length: 9 cm Needle insertion depth: 5 cm cm Catheter type: closed end flexible Catheter size: 19 Gauge Catheter at skin depth: 10 cm Test dose: negative and 1.5% lidocaine  Assessment Events: blood not aspirated, injection not painful, no injection resistance, negative IV test and no paresthesia  Additional Notes Patient is more comfortable after epidural dosed. Please see RN's note for documentation of vital signs and FHR which are stable.

## 2011-04-22 LAB — CBC
HCT: 28.8 % — ABNORMAL LOW (ref 36.0–46.0)
MCH: 29.9 pg (ref 26.0–34.0)
MCV: 91.7 fL (ref 78.0–100.0)
Platelets: 371 10*3/uL (ref 150–400)
RDW: 13.4 % (ref 11.5–15.5)

## 2011-04-22 NOTE — Progress Notes (Signed)
UR Chart review completed.  

## 2011-04-22 NOTE — Anesthesia Postprocedure Evaluation (Signed)
  Anesthesia Post-op Note  Patient: [name]  Procedure(s) Performed: * No procedures listed *  Patient Location: Mother/Baby  Anesthesia Type: Epidural  Level of Consciousness: awake, alert  and oriented  Airway and Oxygen Therapy: Patient Spontanous Breathing  Post-op Pain: none  Post-op Assessment: Post-op Vital signs reviewed and Patient's Cardiovascular Status Stable  Post-op Vital Signs: Reviewed and stable  Complications: No apparent anesthesia complications

## 2011-04-22 NOTE — Discharge Summary (Signed)
Obstetric Discharge Summary Reason for Admission: induction of labor Prenatal Procedures: none Intrapartum Procedures: spontaneous vaginal delivery Postpartum Procedures: none Complications-Operative and Postpartum: right labial abrasion Hemoglobin  Date Value Range Status  04/22/2011 9.4* 12.0-15.0 (g/dL) Final     HCT  Date Value Range Status  04/22/2011 28.8* 36.0-46.0 (%) Final    Discharge Diagnoses: Term Pregnancy-delivered  Discharge Information: Date: 04/22/2011 Activity: pelvic rest Diet: routine Medications: Ibuprofen and Percocet Condition: stable Instructions: refer to practice specific booklet Discharge to: home   Newborn Data: Live born female  Birth Weight: 7 lb 3.2 oz (3265 g) APGAR: 8, 9  Home with mother.  Oliver Pila 04/22/2011, 12:26 PM

## 2011-04-22 NOTE — Progress Notes (Signed)
Post Partum Day 1 Subjective: no complaints, up ad lib and tolerating PO  Objective: Blood pressure 94/56, pulse 64, temperature 98.2 F (36.8 C), temperature source Oral, resp. rate 18, height 5\' 4"  (1.626 m), weight 70.761 kg (156 lb), SpO2 99.00%, unknown if currently breastfeeding.  Physical Exam:  General: alert Lochia: appropriate Uterine Fundus: firm  Basename 04/22/11 0525 04/21/11 0730  HGB 9.4* 10.3*  HCT 28.8* 31.0*    Assessment/Plan: Plan for discharge tomorrow Plans circumcision in office   LOS: 1 day   Marlies Ligman W 04/22/2011, 12:25 PM

## 2011-04-23 MED ORDER — OXYCODONE-ACETAMINOPHEN 5-325 MG PO TABS: 1.0000 | ORAL_TABLET | ORAL | Status: AC | PRN

## 2011-04-23 MED ORDER — IBUPROFEN 600 MG PO TABS: 600.0000 mg | ORAL_TABLET | Freq: Four times a day (QID) | ORAL | Status: AC

## 2011-04-23 NOTE — Progress Notes (Signed)
Post Partum Day 2  Subjective: no complaints and tolerating PO  Objective: Blood pressure 92/55, pulse 65, temperature 98 F (36.7 C), temperature source Oral, resp. rate 18, height 5\' 4"  (1.626 m), weight 70.761 kg (156 lb), SpO2 99.00%, unknown if currently breastfeeding.  Physical Exam:  General: alert Lochia: appropriate Uterine Fundus: firm   Basename 04/22/11 0525 04/21/11 0730  HGB 9.4* 10.3*  HCT 28.8* 31.0*    Assessment/Plan: Discharge home Plans circ in office   LOS: 2 days   Gayle Martinez W 04/23/2011, 7:58 AM

## 2012-10-21 ENCOUNTER — Encounter (HOSPITAL_COMMUNITY): Payer: Self-pay | Admitting: *Deleted

## 2012-10-21 ENCOUNTER — Emergency Department (HOSPITAL_COMMUNITY)
Admission: EM | Admit: 2012-10-21 | Discharge: 2012-10-22 | Disposition: A | Discharge status: Home / Self Care | Payer: Medicaid Other | Attending: Emergency Medicine | Admitting: Emergency Medicine

## 2012-10-21 DIAGNOSIS — Z23 Encounter for immunization: Secondary | ICD-10-CM | POA: Insufficient documentation

## 2012-10-21 DIAGNOSIS — Y929 Unspecified place or not applicable: Secondary | ICD-10-CM | POA: Insufficient documentation

## 2012-10-21 DIAGNOSIS — S61219A Laceration without foreign body of unspecified finger without damage to nail, initial encounter: Secondary | ICD-10-CM

## 2012-10-21 DIAGNOSIS — W260XXA Contact with knife, initial encounter: Secondary | ICD-10-CM | POA: Insufficient documentation

## 2012-10-21 DIAGNOSIS — Y9389 Activity, other specified: Secondary | ICD-10-CM | POA: Insufficient documentation

## 2012-10-21 DIAGNOSIS — W261XXA Contact with sword or dagger, initial encounter: Secondary | ICD-10-CM | POA: Insufficient documentation

## 2012-10-21 DIAGNOSIS — F17200 Nicotine dependence, unspecified, uncomplicated: Secondary | ICD-10-CM | POA: Insufficient documentation

## 2012-10-21 DIAGNOSIS — S61209A Unspecified open wound of unspecified finger without damage to nail, initial encounter: Secondary | ICD-10-CM | POA: Insufficient documentation

## 2012-10-21 DIAGNOSIS — Z8619 Personal history of other infectious and parasitic diseases: Secondary | ICD-10-CM | POA: Insufficient documentation

## 2012-10-21 MED ORDER — TETANUS-DIPHTH-ACELL PERTUSSIS 5-2.5-18.5 LF-MCG/0.5 IM SUSP
0.5000 mL | Freq: Once | INTRAMUSCULAR | Status: AC
  Administered 2012-10-21: 0.5 mL via INTRAMUSCULAR
  Filled 2012-10-21: qty 0.5

## 2012-10-21 MED ORDER — HYDROCODONE-ACETAMINOPHEN 5-325 MG PO TABS
1.0000 | ORAL_TABLET | Freq: Once | ORAL | Status: AC
  Administered 2012-10-21: 1 via ORAL
  Filled 2012-10-21: qty 1

## 2012-10-21 NOTE — ED Notes (Signed)
Laceration to the lt middle finger with a knife while cutting  Chocolate.  bandaged

## 2012-10-21 NOTE — ED Provider Notes (Signed)
History  This chart was scribed for non-physician practitioner Francee Piccolo, PA-C, working with No att. providers found, by Yevette Edwards, ED Scribe. This patient was seen in room TR11C/TR11C and the patient's care was started at 10:17 PM.  CSN: [id number] Arrival date & time 10/21/12  2146  None    Chief Complaint  Patient presents with  . Laceration    The history is provided by the patient. No language interpreter was used.   HPI Comments: [name] is a 27 y.o. female who presents to the Emergency Department complaining of a laceration to the left middle finger which occurred two hours ago when she was cutting chocolate with a knife. Patient describes pain as constant sharp w/o radiation, rates it 8/10. Patient states injury occurred 2 hours ago. Denies numbness or tingling or decrease ROM. The pt denies a fever, chills, or any other pain. She cannot recall the date of her last tetanus.   Past Medical History  Diagnosis Date  . History of chicken pox   . History of chlamydia    Past Surgical History  Procedure Laterality Date  . Breast surgery      2 llumps remosved in 2007  . Cholecystectomy  2010   Family History  Problem Relation Age of Onset  . Diabetes Maternal Grandmother   . Hypertension Maternal Grandmother   . Diabetes Paternal Grandmother   . Hypertension Paternal Grandmother   . Diabetes Father   . Cancer Paternal Aunt    History  Substance Use Topics  . Smoking status: Current Every Day Smoker -- 1.00 packs/day  . Smokeless tobacco: Never Used  . Alcohol Use: No   OB History   Grav Para Term Preterm Abortions TAB SAB Ect Mult Living   5 4 4  1  1   4      Review of Systems  Constitutional: Negative for fever and chills.  Skin: Positive for wound.  All other systems reviewed and are negative.    Allergies  Review of patient's allergies indicates no known allergies.  Home Medications  No current outpatient prescriptions on  file.  Triage Vitals: BP 113/73  Pulse 88  Temp(Src) 98.8 F (37.1 C) (Oral)  Resp 18  SpO2 97%  Physical Exam  Nursing note and vitals reviewed. Constitutional: She is oriented to person, place, and time. She appears well-developed and well-nourished. No distress.  HENT:  Head: Normocephalic and atraumatic.  Eyes: Conjunctivae are normal.  Neck: Neck supple.  Cardiovascular: Intact distal pulses.   Cap refill < 3 sec  Pulmonary/Chest: Effort normal.  Musculoskeletal:       Left hand: She exhibits tenderness and laceration. She exhibits normal range of motion, no bony tenderness, normal two-point discrimination, normal capillary refill, no deformity and no swelling. Normal sensation noted. Normal strength noted.  0.5 cm laceration below left middle finger nail, not actively bleeding or draining.   Neurological: She is alert and oriented to person, place, and time.  Skin: Skin is warm and dry. She is not diaphoretic.  0.5 cm superficial laceration to left middle finger. Sensations intact.   Psychiatric: She has a normal mood and affect.    ED Course  Procedures (including critical care time)  Medications  TDaP (BOOSTRIX) injection 0.5 mL (0.5 mLs Intramuscular Given 10/21/12 2227)  HYDROcodone-acetaminophen (NORCO/VICODIN) 5-325 MG per tablet 1 tablet (1 tablet Oral Given 10/21/12 2234)     LACERATION REPAIR Performed by: Jeannetta Ellis Authorized by: Jeannetta Ellis Consent:  Verbal consent obtained. Risks and benefits: risks, benefits and alternatives were discussed Consent given by: patient Patient identity confirmed: provided demographic data Prepped and Draped in normal sterile fashion Wound explored  Laceration Location: left middle finger below nail  Laceration Length: 0.5cm  No Foreign Bodies seen or palpated  Anesthesia: local infiltration  Local anesthetic: none  Anesthetic total: 0 ml  Irrigation method: syringe Amount of cleaning:  standard  Skin closure: dermabond  Number of sutures: 0  Technique: dermabond  Patient tolerance: Patient tolerated the procedure well with no immediate complications.    DIAGNOSTIC STUDIES: Oxygen Saturation is 97% on room air, normal by my interpretation.    COORDINATION OF CARE:  10:23 PM- Discussed treatment plan with pt which includes wound care and a tetanus booster, and the pt agreed.     Labs Reviewed - No data to display No results found. 1. Laceration of finger without complication, initial encounter     MDM  Tdap booster given. Wound cleaning complete with pressure irrigation, bottom of wound visualized, no foreign bodies appreciated. Laceration occurred < 8 hours prior to repair which was well tolerated. Pt has no co morbidities to effect normal wound healing. Discussed dermabond home care w pt and answered questions. Pt is hemodynamically stable w no complaints prior to dc. Patient is agreeable to plan. Patient is stable at time of discharge      I personally performed the services described in this documentation, which was scribed in my presence. The recorded information has been reviewed and is accurate.    Jeannetta Ellis, PA-C 10/21/12 2351

## 2012-10-22 NOTE — ED Provider Notes (Signed)
Medical screening examination/treatment/procedure(s) were performed by non-physician practitioner and as supervising physician I was immediately available for consultation/collaboration.   Richardean Canal, MD 10/22/12 0000

## 2013-01-02 ENCOUNTER — Encounter (HOSPITAL_COMMUNITY): Payer: Self-pay

## 2013-01-02 ENCOUNTER — Emergency Department (HOSPITAL_COMMUNITY)
Admission: EM | Admit: 2013-01-02 | Discharge: 2013-01-02 | Discharge status: Against Medical Advice | Payer: Medicaid Other | Attending: Emergency Medicine | Admitting: Emergency Medicine

## 2013-01-02 DIAGNOSIS — Z3202 Encounter for pregnancy test, result negative: Secondary | ICD-10-CM | POA: Insufficient documentation

## 2013-01-02 DIAGNOSIS — R1110 Vomiting, unspecified: Secondary | ICD-10-CM | POA: Insufficient documentation

## 2013-01-02 DIAGNOSIS — R51 Headache: Secondary | ICD-10-CM | POA: Insufficient documentation

## 2013-01-02 DIAGNOSIS — R109 Unspecified abdominal pain: Secondary | ICD-10-CM | POA: Insufficient documentation

## 2013-01-02 DIAGNOSIS — F17200 Nicotine dependence, unspecified, uncomplicated: Secondary | ICD-10-CM | POA: Insufficient documentation

## 2013-01-02 LAB — URINALYSIS, ROUTINE W REFLEX MICROSCOPIC
Bilirubin Urine: NEGATIVE
Ketones, ur: NEGATIVE mg/dL
Nitrite: NEGATIVE
Protein, ur: NEGATIVE mg/dL
Specific Gravity, Urine: 1.007 (ref 1.005–1.030)
Urobilinogen, UA: 1 mg/dL (ref 0.0–1.0)
pH: 6.5 (ref 5.0–8.0)

## 2013-01-02 LAB — COMPREHENSIVE METABOLIC PANEL
ALT: 13 U/L (ref 0–35)
AST: 27 U/L (ref 0–37)
Albumin: 4.5 g/dL (ref 3.5–5.2)
CO2: 26 mEq/L (ref 19–32)
Calcium: 8.8 mg/dL (ref 8.4–10.5)
Chloride: 102 mEq/L (ref 96–112)
Creatinine, Ser: 0.68 mg/dL (ref 0.50–1.10)
Sodium: 139 mEq/L (ref 135–145)
Total Protein: 7.9 g/dL (ref 6.0–8.3)

## 2013-01-02 LAB — CBC WITH DIFFERENTIAL/PLATELET
Basophils Absolute: 0 10*3/uL (ref 0.0–0.1)
Basophils Relative: 0 % (ref 0–1)
Eosinophils Absolute: 0.3 10*3/uL (ref 0.0–0.7)
Eosinophils Relative: 4 % (ref 0–5)
HCT: 42.7 % (ref 36.0–46.0)
Lymphocytes Relative: 12 % (ref 12–46)
MCHC: 35.8 g/dL (ref 30.0–36.0)
MCV: 92.2 fL (ref 78.0–100.0)
Monocytes Absolute: 0.4 10*3/uL (ref 0.1–1.0)
Neutro Abs: 5.9 10*3/uL (ref 1.7–7.7)
Neutrophils Relative %: 79 % — ABNORMAL HIGH (ref 43–77)
Platelets: 254 10*3/uL (ref 150–400)
RDW: 12.7 % (ref 11.5–15.5)
WBC: 7.4 10*3/uL (ref 4.0–10.5)

## 2013-01-02 LAB — POCT PREGNANCY, URINE: Preg Test, Ur: NEGATIVE

## 2013-01-02 NOTE — ED Notes (Signed)
Pt c/o lower abd pain, headache, nausea, vomiting, lightheaded with standing, unable to keep any food or fluids down since 0730 this am

## 2013-10-28 ENCOUNTER — Inpatient Hospital Stay (HOSPITAL_COMMUNITY)
Admission: AD | Admit: 2013-10-28 | Discharge: 2013-10-28 | Disposition: A | Discharge status: Home / Self Care | Payer: Medicaid Other | Source: Ambulatory Visit | Attending: Obstetrics & Gynecology | Admitting: Obstetrics & Gynecology

## 2013-10-28 ENCOUNTER — Encounter (HOSPITAL_COMMUNITY): Payer: Self-pay

## 2013-10-28 DIAGNOSIS — M545 Low back pain, unspecified: Secondary | ICD-10-CM | POA: Diagnosis not present

## 2013-10-28 DIAGNOSIS — N926 Irregular menstruation, unspecified: Secondary | ICD-10-CM | POA: Insufficient documentation

## 2013-10-28 DIAGNOSIS — F17200 Nicotine dependence, unspecified, uncomplicated: Secondary | ICD-10-CM | POA: Diagnosis not present

## 2013-10-28 DIAGNOSIS — N938 Other specified abnormal uterine and vaginal bleeding: Secondary | ICD-10-CM | POA: Diagnosis present

## 2013-10-28 DIAGNOSIS — N949 Unspecified condition associated with female genital organs and menstrual cycle: Secondary | ICD-10-CM | POA: Diagnosis present

## 2013-10-28 LAB — URINALYSIS, ROUTINE W REFLEX MICROSCOPIC
BILIRUBIN URINE: NEGATIVE
Glucose, UA: NEGATIVE mg/dL
Hgb urine dipstick: NEGATIVE
Ketones, ur: 15 mg/dL — AB
Nitrite: NEGATIVE
Protein, ur: NEGATIVE mg/dL
SPECIFIC GRAVITY, URINE: 1.01 (ref 1.005–1.030)
UROBILINOGEN UA: 0.2 mg/dL (ref 0.0–1.0)
pH: 6 (ref 5.0–8.0)

## 2013-10-28 LAB — URINE MICROSCOPIC-ADD ON

## 2013-10-28 LAB — HCG, QUANTITATIVE, PREGNANCY

## 2013-10-28 NOTE — Discharge Instructions (Signed)
Menstruation °Menstruation is the monthly passing of blood, tissue, fluid and mucus, also know as a period. Your body is shedding the lining of the uterus. The flow, or amount of blood, usually lasts from 3-7 days each month. Hormones control the menstrual cycle. Hormones are a chemical substance produced by endocrine glands in the body to regulate different bodily functions. °The first menstrual period may start any time between age 26 years to 16 years. However, it usually starts around age 12 years. Some girls have regular monthly menstrual cycles right from the beginning. However, it is not unusual to have only a couple of drops of blood or spotting when you first start menstruating. It is also not unusual to have two periods a month or miss a month or two when first starting your periods. °SYMPTOMS  °· Mild to moderate abdominal cramps. °· Aching or pain in the lower back area. °Symptoms may occur 5-10 days before your menstrual period starts. These symptoms are referred to as premenstrual syndrome (PMS). These symptoms can include: °· Headache. °· Breast tenderness and swelling. °· Bloating. °· Tiredness (fatigue). °· Mood changes. °· Craving for certain foods. °These are normal signs and symptoms and can vary in severity. To help relieve these problems, ask your caregiver if you can take over-the-counter medications for pain or discomfort. If the symptoms are not controllable, see your caregiver for help.  °HORMONES INVOLVED IN MENSTRUATION °Menstruation comes about because of hormones produced by the pituitary gland in the brain and the ovaries that affect the uterine lining. °First, the pituitary gland in the brain produces the hormone follicle stimulating hormone (FSH). FSH stimulates the ovaries to produce estrogen, which thickens the uterine lining and begins to develop an egg in the ovary. About 14 days later, the pituitary gland produces another hormone called luteinizing hormone (LH). LH causes the egg  to come out of a sac in the ovary (ovulation). The empty sac on the ovary called the corpus luteum is stimulated by another hormone from the pituitary gland called luteotropin. The corpus luteum begins to produce the estrogen and progesterone hormone. The progesterone hormone prepares the lining of the uterus to have the fertilized egg (egg combined with sperm) attach to the lining of the uterus and begin to develop into a fetus. If the egg is not fertilized, the corpus luteum stops producing estrogen and progesterone, it disappears, the lining of the uterus sloughs off and a menstrual period begins. Then the menstrual cycle starts all over again and will continue monthly unless pregnancy occurs or menopause begins. °The secretion of hormones is complex. Various parts of the body become involved in many chemical activities. Female sex hormones have other functions in a woman's body as well. Estrogen increases a woman's sex drive (libido). It naturally helps body get rid of fluids (diuretic). It also aids in the process of building new bone. Therefore, maintaining hormonal health is essential to all levels of a woman's well being. These hormones are usually present in normal amounts and cause you to menstruate. It is the relationship between the (small) levels of the hormones that is critical. When the balance is upset, menstrual irregularities can occur. °HOW DOES THE MENSTRUAL CYCLE HAPPEN? °· Menstrual cycles vary in length from 21-35 days with an average of 29 days. The cycle begins on the first day of bleeding. At this time, the pituitary gland in the brain releases FSH that travels through the bloodstream to the ovaries. The FSH stimulates the follicles in the   ovaries. This prepares the body for ovulation that occurs around the 14th day of the cycle. The ovaries produce estrogen, and this makes sure conditions are right in the uterus for implantation of the fertilized egg. °· When the levels of estrogen reach a  high enough level, it signals the gland in the brain (pituitary gland) to release a surge of LH. This causes the release of the ripest egg from its follicle (ovulation). Usually only one follicle releases one egg, but sometimes more than one follicle releases an egg especially when stimulating the ovaries for in vitro fertilization. The egg can then be collected by either fallopian tube to await fertilization. The burst follicle within the ovary that is left behind is now called the corpus luteum or "yellow body." The corpus luteum continues to give off (secrete) reduced amounts of estrogen. This closes and hardens the cervix. It dries up the mucus to the naturally infertile condition. °· The corpus luteum also begins to give off greater amounts of progesterone. This causes the lining of the uterus (endometrium) to thicken even more in preparation for the fertilized egg. The egg is starting to journey down from the fallopian tube to the uterus. It also signals the ovaries to stop releasing eggs. It assists in returning the cervical mucus to its infertile state. °· If the egg implants successfully into the womb lining and pregnancy occurs, progesterone levels will continue to raise. It is often this hormone that gives some pregnant women a feeling of well being, like a "natural high." Progesterone levels drop again after childbirth. °· If fertilization does not occur, the corpus luteum dies, stopping the production of hormones. This sudden drop in progesterone causes the uterine lining to break down, accompanied by blood (menstruation). °· This starts the cycle back at day 1. The whole process starts all over again. Woman go through this cycle every month from puberty to menopause. Women have breaks only for pregnancy and breastfeeding (lactation), unless the woman has health problems that affect the female hormone system or chooses to use oral contraceptives to have unnatural menstrual periods. °HOME CARE  INSTRUCTIONS  °· Keep track of your periods by using a calendar. °· If you use tampons, get the least absorbent to avoid toxic shock syndrome. °· Do not leave tampons in the vagina over night or longer than 6 hours. °· Wear a sanitary pad over night. °· Exercise 3-5 times a week or more. °· Avoid foods and drinks that you know will make your symptoms worse before or during your period. °SEEK MEDICAL CARE IF:  °· You develop a fever with your period. °· Your periods are lasting more than 7 days. °· Your period is so heavy that you have to change pads or tampons every 30 minutes. °· You develop clots with your period and never had clots before. °· You cannot get relief from over-the-counter medication for your symptoms. °· Your period has not started, and it has been longer than 35 days. °Document Released: 03/19/2002 Document Revised: 04/03/2013 Document Reviewed: 10/26/2012 °[employer]® Patient Information ©2015 [employer]. This information is not intended to replace advice given to you by your health care provider. Make sure you discuss any questions you have with your health care provider. ° °

## 2013-10-28 NOTE — MAU Provider Note (Signed)
History     CSN: [id number]  Arrival date and time: 10/28/13 1814   First Provider Initiated Contact with Patient 10/28/13 1851      Chief Complaint  Patient presents with  . Vaginal Bleeding   HPI 28 y.o. [id number] with positive pregnancy test at home on 10/13/13.Patient's last menstrual period was 09/08/2013. States she had spotting a week or two ago, passed blood clot the size of a "fifty cent piece" this morning. No pelvic or abd pain, no active bleeding or discharge now. Reports low back pain for a week or so, worse with bending over, no better w/ Motrin.   Past Medical History  Diagnosis Date  . History of chicken pox   . History of chlamydia     Past Surgical History  Procedure Laterality Date  . Breast surgery      2 llumps remosved in 2007  . Cholecystectomy  2010    Family History  Problem Relation Age of Onset  . Diabetes Maternal Grandmother   . Hypertension Maternal Grandmother   . Diabetes Paternal Grandmother   . Hypertension Paternal Grandmother   . Diabetes Father   . Cancer Paternal Aunt     History  Substance Use Topics  . Smoking status: Current Every Day Smoker -- 1.00 packs/day  . Smokeless tobacco: Never Used  . Alcohol Use: No    Allergies: No Known Allergies  Prescriptions prior to admission  Medication Sig Dispense Refill  . ibuprofen (ADVIL,MOTRIN) 200 MG tablet Take 400 mg by mouth every 6 (six) hours as needed for moderate pain.        Review of Systems  Constitutional: Negative.   Respiratory: Negative.   Cardiovascular: Negative.   Gastrointestinal: Negative for nausea, vomiting, abdominal pain, diarrhea and constipation.  Genitourinary: Negative for dysuria, urgency, frequency, hematuria and flank pain.       + bleeding, negative d/c  Musculoskeletal: Positive for back pain.  Neurological: Negative.   Psychiatric/Behavioral: Negative.    Physical Exam   Blood pressure 119/79, pulse 83, temperature 98.9 F (37.2 C),  temperature source Oral, resp. rate 18, last menstrual period 09/08/2013.  Physical Exam  Nursing note and vitals reviewed. Constitutional: She is oriented to person, place, and time. She appears well-developed and well-nourished. No distress.  Cardiovascular: Normal rate.   Respiratory: Effort normal.  Genitourinary:  Declines exam  Neurological: She is alert and oriented to person, place, and time.  Skin: Skin is warm and dry.  Psychiatric: She has a normal mood and affect.    MAU Course  Procedures  Results for orders placed during the hospital encounter of 10/28/13 (from the past 24 hour(s))  URINALYSIS, ROUTINE W REFLEX MICROSCOPIC     Status: Abnormal   Collection Time    10/28/13  6:25 PM      Result Value Ref Range   Color, Urine YELLOW  YELLOW   APPearance CLEAR  CLEAR   Specific Gravity, Urine 1.010  1.005 - 1.030   pH 6.0  5.0 - 8.0   Glucose, UA NEGATIVE  NEGATIVE mg/dL   Hgb urine dipstick NEGATIVE  NEGATIVE   Bilirubin Urine NEGATIVE  NEGATIVE   Ketones, ur 15 (*) NEGATIVE mg/dL   Protein, ur NEGATIVE  NEGATIVE mg/dL   Urobilinogen, UA 0.2  0.0 - 1.0 mg/dL   Nitrite NEGATIVE  NEGATIVE   Leukocytes, UA SMALL (*) NEGATIVE  URINE MICROSCOPIC-ADD ON     Status: Abnormal   Collection Time    10/28/13  6:25 PM      Result Value Ref Range   Squamous Epithelial / LPF FEW (*) RARE   WBC, UA 11-20  <3 WBC/hpf   RBC / HPF 0-2  <3 RBC/hpf   Bacteria, UA MANY (*) RARE  HCG, QUANTITATIVE, PREGNANCY     Status: None   Collection Time    10/28/13  7:05 PM      Result Value Ref Range   hCG, Beta Chain, Quant, S <1  <5 mIU/mL   Negative UPT  Assessment and Plan  Care assumed by Thressa ShellerHeather Golden Gilreath, CNM Quant HCG pending  1. Irregular menstrual cycle    FU with the health department as needed Return to MAU as needed   Medication List         ibuprofen 200 MG tablet  Commonly known as:  ADVIL,MOTRIN  Take 400 mg by mouth every 6 (six) hours as needed for moderate  pain.       Follow-up Information   Follow up with [facility] [place]. (As needed)    Contact information:   [address] [place] [address] [phone]      Anise SalvoHogan, Makaylie Dedeaux Donovan      Candus Braud Donovan 10/28/2013, 8:15 PM

## 2013-10-28 NOTE — MAU Note (Signed)
Pt presents to MAU with complaints of vaginal bleeding and she passed a large clot this morning. She reports a + HPT on July the 4th.

## 2014-02-02 ENCOUNTER — Emergency Department (HOSPITAL_COMMUNITY)
Admission: EM | Admit: 2014-02-02 | Discharge: 2014-02-02 | Disposition: A | Discharge status: Home / Self Care | Payer: Medicaid Other | Attending: Emergency Medicine | Admitting: Emergency Medicine

## 2014-02-02 ENCOUNTER — Encounter (HOSPITAL_COMMUNITY): Payer: Self-pay | Admitting: Emergency Medicine

## 2014-02-02 ENCOUNTER — Emergency Department (HOSPITAL_COMMUNITY): Payer: Medicaid Other

## 2014-02-02 DIAGNOSIS — Z9889 Other specified postprocedural states: Secondary | ICD-10-CM | POA: Diagnosis not present

## 2014-02-02 DIAGNOSIS — J40 Bronchitis, not specified as acute or chronic: Secondary | ICD-10-CM | POA: Insufficient documentation

## 2014-02-02 DIAGNOSIS — R079 Chest pain, unspecified: Secondary | ICD-10-CM

## 2014-02-02 DIAGNOSIS — Z720 Tobacco use: Secondary | ICD-10-CM | POA: Diagnosis not present

## 2014-02-02 DIAGNOSIS — R05 Cough: Secondary | ICD-10-CM | POA: Diagnosis present

## 2014-02-02 DIAGNOSIS — R059 Cough, unspecified: Secondary | ICD-10-CM

## 2014-02-02 DIAGNOSIS — Z8619 Personal history of other infectious and parasitic diseases: Secondary | ICD-10-CM | POA: Insufficient documentation

## 2014-02-02 MED ORDER — PREDNISONE 20 MG PO TABS: 40.0000 mg | ORAL_TABLET | Freq: Every day | ORAL | Status: DC

## 2014-02-02 MED ORDER — ALBUTEROL SULFATE HFA 108 (90 BASE) MCG/ACT IN AERS: 1.0000 | INHALATION_SPRAY | Freq: Four times a day (QID) | RESPIRATORY_TRACT | Status: DC | PRN

## 2014-02-02 MED ORDER — HYDROCOD POLST-CHLORPHEN POLST 10-8 MG/5ML PO LQCR
5.0000 mL | Freq: Two times a day (BID) | ORAL | Status: DC | PRN
Start: 2014-02-02 — End: 2014-11-19

## 2014-02-02 MED ORDER — IPRATROPIUM-ALBUTEROL 0.5-2.5 (3) MG/3ML IN SOLN
3.0000 mL | Freq: Once | RESPIRATORY_TRACT | Status: AC
  Administered 2014-02-02: 3 mL via RESPIRATORY_TRACT
  Filled 2014-02-02: qty 3

## 2014-02-02 NOTE — ED Notes (Signed)
Pt reports having sore throat, productive cough with green sputum and chest wall pain that occurs when she coughs. No acute distress noted at triage.

## 2014-02-02 NOTE — ED Provider Notes (Signed)
CSN: [id number]     Arrival date & time 02/02/14  [id number] History   First MD Initiated Contact with Patient 02/02/14 0901     Chief Complaint  Patient presents with  . Cough  . Sore Throat     (Consider location/radiation/quality/duration/timing/severity/associated sxs/prior Treatment) The history is provided by the patient and medical records.   This is a 28 year old female with no significant past medical history presenting to the ED for sore throat and productive cough with thick white sputum over the past few days.  States this morning she began having some chest pain and tightness, worse with coughing. She denies any shortness of breath, palpitations, dizziness, weakness, or diaphoresis.  Denies fever, chills.  No known sick contacts.  Patient has no prior cardiac hx.  Patient is a daily smoker, approx 1PPD.  No intervention tried PTA.  VS stable on arrival.  Past Medical History  Diagnosis Date  . History of chicken pox   . History of chlamydia    Past Surgical History  Procedure Laterality Date  . Breast surgery      2 llumps remosved in 2007  . Cholecystectomy  2010   Family History  Problem Relation Age of Onset  . Diabetes Maternal Grandmother   . Hypertension Maternal Grandmother   . Diabetes Paternal Grandmother   . Hypertension Paternal Grandmother   . Diabetes Father   . Cancer Paternal Aunt    History  Substance Use Topics  . Smoking status: Current Every Day Smoker -- 1.00 packs/day    Types: Cigarettes  . Smokeless tobacco: Never Used  . Alcohol Use: No   OB History   Grav Para Term Preterm Abortions TAB SAB Ect Mult Living   5 4 4  1  1   4      Review of Systems  HENT: Positive for sore throat.   Respiratory: Positive for cough.   All other systems reviewed and are negative.     Allergies  Review of patient's allergies indicates no known allergies.  Home Medications   Prior to Admission medications   Medication Sig Start Date End Date  Taking? Authorizing Provider  ibuprofen (ADVIL,MOTRIN) 200 MG tablet Take 400 mg by mouth every 6 (six) hours as needed for moderate pain.    Historical Provider, MD   BP 108/70  Pulse 90  Temp(Src) 98.6 F (37 C) (Oral)  Resp 18  SpO2 94%  Physical Exam  Nursing note and vitals reviewed. Constitutional: She is oriented to person, place, and time. She appears well-developed and well-nourished. No distress.  HENT:  Head: Normocephalic and atraumatic.  Right Ear: Tympanic membrane and ear canal normal.  Left Ear: Tympanic membrane and ear canal normal.  Nose: Nose normal.  Mouth/Throat: Uvula is midline and mucous membranes are normal. Posterior oropharyngeal erythema present. No oropharyngeal exudate, posterior oropharyngeal edema or tonsillar abscesses.  Tonsils normal in appearance bilaterally without exudate; uvula midline without peritonsillar abscess; handling secretions appropriately; no difficulty swallowing or speaking  Eyes: Conjunctivae and EOM are normal. Pupils are equal, round, and reactive to light.  Neck: Normal range of motion. Neck supple.  Cardiovascular: Normal rate, regular rhythm and normal heart sounds.   Pulmonary/Chest: Effort normal. No respiratory distress. She has wheezes. She exhibits tenderness. She exhibits no bony tenderness.  Respirations unlabored, faint wheeze right lung base Chest pain reproducible with palpation to anterior wall  Abdominal: Soft. Bowel sounds are normal. There is no tenderness. There is no guarding.  Musculoskeletal: Normal range  of motion. She exhibits no edema.  Neurological: She is alert and oriented to person, place, and time.  Skin: Skin is warm and dry. She is not diaphoretic.  Psychiatric: She has a normal mood and affect.    ED Course  Procedures (including critical care time) Labs Review Labs Reviewed - No data to display  Imaging Review Dg Chest 2 View  02/02/2014   CLINICAL DATA:  Productive cough. Short of  breath. Sternal region chest pain with coughing. Chest pain present for 6 hr. History of smoking in the past.  EXAM: CHEST  2 VIEW  COMPARISON:  10/09/2007  FINDINGS: Heart, mediastinum hila normal. Lungs are clear. No pleural effusion or pneumothorax. Bony thorax is unremarkable.  IMPRESSION: Normal chest radiographs.   Electronically Signed   By: Amie Portlandavid  Ormond M.D.   On: 02/02/2014 10:36     EKG Interpretation None        Date: 02/02/2014  Rate: 66  Rhythm: normal sinus rhythm  QRS Axis: normal  Intervals: normal  ST/T Wave abnormalities: normal  Conduction Disutrbances:none  Narrative Interpretation:   Old EKG Reviewed: none available   MDM   Final diagnoses:  Cough  Bronchitis  Chest pain, unspecified chest pain type   [age] y.o. F with cough and sore throat x few days.  Some pain noted with heavy coughing.  EKG NSR without ischemic changes.  Patient afebrile and non-toxic appearing.  Some faint wheezes noted in right lower lung fields.  duoneb given, will obtain CXR.  VS remain stable at this time.  CXR clear.  Lung sounds have cleared after duoneb.  VS remain stable on RA.  Sx likely due to viral URI/bronchitis.  Low suspicion for ACS, PE, dissection, or other acute cardiac event.  Patient PERC negative.  Will d/c home with cough suppressants, prednisone, and albuterol inhaler.  Encouraged FU with wellness clinic if no impovement in the next few days.  Discussed plan with patient, he/she acknowledged understanding and agreed with plan of care.  Return precautions given for new or worsening symptoms.  Garlon HatchetLisa M Sanders, PA-C 02/02/14 [phone]

## 2014-02-02 NOTE — Discharge Instructions (Signed)
Take the prescribed medication as directed.  Rest and drink plenty of fluids to keep yourself hydrated. [facility] if no improvement in the next few days. Return to the ED for new or worsening symptoms.

## 2014-02-03 NOTE — ED Provider Notes (Signed)
Medical screening examination/treatment/procedure(s) were performed by non-physician practitioner and as supervising physician I was immediately available for consultation/collaboration.   EKG Interpretation None       Juliet RudeNathan R. Rubin PayorPickering, MD 02/03/14 [phone]

## 2014-02-11 ENCOUNTER — Encounter (HOSPITAL_COMMUNITY): Payer: Self-pay | Admitting: Emergency Medicine

## 2014-05-10 ENCOUNTER — Emergency Department (HOSPITAL_COMMUNITY)
Admission: EM | Admit: 2014-05-10 | Discharge: 2014-05-10 | Disposition: A | Discharge status: Home / Self Care | Payer: Medicaid Other | Source: Home / Self Care | Attending: Family Medicine | Admitting: Family Medicine

## 2014-05-10 ENCOUNTER — Encounter (HOSPITAL_COMMUNITY): Payer: Self-pay

## 2014-05-10 DIAGNOSIS — R6889 Other general symptoms and signs: Secondary | ICD-10-CM

## 2014-05-10 MED ORDER — IPRATROPIUM BROMIDE 0.06 % NA SOLN: 2.0000 | Freq: Four times a day (QID) | NASAL | Status: DC

## 2014-05-10 MED ORDER — OSELTAMIVIR PHOSPHATE 75 MG PO CAPS: 75.0000 mg | ORAL_CAPSULE | Freq: Two times a day (BID) | ORAL | Status: DC

## 2014-05-10 NOTE — ED Provider Notes (Signed)
CSN: [id number]     Arrival date & time 05/10/14  1419 History   First MD Initiated Contact with Patient 05/10/14 1449     Chief Complaint  Patient presents with  . Fever   (Consider location/radiation/quality/duration/timing/severity/associated sxs/prior Treatment) Patient is a 29 y.o. female presenting with fever. The history is provided by the patient.  Fever Temp source:  Tactile Severity:  Mild Onset quality:  Sudden Duration:  2 hours Progression:  Unchanged Chronicity:  New Associated symptoms: chills, cough, myalgias and rhinorrhea   Associated symptoms: no diarrhea, no nausea, no rash, no sore throat and no vomiting   Risk factors: sick contacts     Past Medical History  Diagnosis Date  . History of chicken pox   . History of chlamydia    Past Surgical History  Procedure Laterality Date  . Breast surgery      2 llumps remosved in 2007  . Cholecystectomy  2010   Family History  Problem Relation Age of Onset  . Diabetes Maternal Grandmother   . Hypertension Maternal Grandmother   . Diabetes Paternal Grandmother   . Hypertension Paternal Grandmother   . Diabetes Father   . Cancer Paternal Aunt    History  Substance Use Topics  . Smoking status: Current Every Day Smoker -- 1.00 packs/day    Types: Cigarettes  . Smokeless tobacco: Never Used  . Alcohol Use: No   OB History    Gravida Para Term Preterm AB TAB SAB Ectopic Multiple Living   Review of Systems  Constitutional: Positive for fever and chills.  HENT: Positive for postnasal drip and rhinorrhea. Negative for sore throat.   Respiratory: Positive for cough.   Gastrointestinal: Negative for nausea, vomiting and diarrhea.  Musculoskeletal: Positive for myalgias.  Skin: Negative for rash.    Allergies  Review of patient's allergies indicates no known allergies.  Home Medications   Prior to Admission medications   Medication Sig Start Date End Date Taking? Authorizing  Provider  albuterol (PROVENTIL HFA;VENTOLIN HFA) 108 (90 BASE) MCG/ACT inhaler Inhale 1-2 puffs into the lungs every 6 (six) hours as needed for wheezing. 02/02/14   Garlon Hatchet, PA-C  chlorpheniramine-HYDROcodone (TUSSIONEX PENNKINETIC ER) 10-8 MG/5ML LQCR Take 5 mLs by mouth every 12 (twelve) hours as needed for cough (Cough). 02/02/14   Garlon Hatchet, PA-C  IBUPROFEN CHILDRENS PO Take 5 mLs by mouth every 6 (six) hours as needed (for pain).    Historical Provider, MD  ipratropium (ATROVENT) 0.06 % nasal spray Place 2 sprays into both nostrils 4 (four) times daily. 05/10/14   Linna Hoff, MD  oseltamivir (TAMIFLU) 75 MG capsule Take 1 capsule (75 mg total) by mouth every 12 (twelve) hours. Take all of medication. 05/10/14   Linna Hoff, MD  predniSONE (DELTASONE) 20 MG tablet Take 2 tablets (40 mg total) by mouth daily. Take 40 mg by mouth daily for 3 days, then  by mouth daily for 3 days, then  daily for 3 days 02/02/14   Garlon Hatchet, PA-C   BP 118/73 mmHg  Pulse 76  Temp(Src) 97.8 F (36.6 C) (Oral)  Resp 14  SpO2 100% Physical Exam  Constitutional: She is oriented to person, place, and time. She appears well-developed and well-nourished.  HENT:  Head: Normocephalic.  Right Ear: External ear normal.  Left Ear: External ear normal.  Mouth/Throat: Oropharynx is clear and moist.  Eyes:  Pupils are equal, round, and reactive to light.  Neck: Normal range of motion. Neck supple.  Cardiovascular: Normal rate, regular rhythm, normal heart sounds and intact distal pulses.   Pulmonary/Chest: Effort normal and breath sounds normal.  Lymphadenopathy:    She has no cervical adenopathy.  Neurological: She is alert and oriented to person, place, and time.  Skin: Skin is warm and dry.  Nursing note and vitals reviewed.   ED Course  Procedures (including critical care time) Labs Review Labs Reviewed - No data to display  Imaging Review No results found.   MDM   1.  Influenza-like symptoms        Linna HoffJames D Johnita Palleschi, MD 05/10/14 [phone]

## 2014-05-10 NOTE — ED Notes (Signed)
C/o fever, chills , blood tinted nasal secretions, ST. "Whenever I get sick like this , it turns into bronchitis"

## 2014-05-10 NOTE — Discharge Instructions (Signed)
Drink plenty of fluids as discussed, use medicine as prescribed, and mucinex or delsym for cough. Return or see your doctor if further problems °

## 2014-05-30 ENCOUNTER — Inpatient Hospital Stay (HOSPITAL_COMMUNITY)
Admission: AD | Admit: 2014-05-30 | Discharge: 2014-05-30 | Discharge status: Against Medical Advice | Payer: Medicaid Other | Source: Ambulatory Visit | Attending: Obstetrics and Gynecology | Admitting: Obstetrics and Gynecology

## 2014-05-30 DIAGNOSIS — Z3202 Encounter for pregnancy test, result negative: Secondary | ICD-10-CM | POA: Insufficient documentation

## 2014-05-30 DIAGNOSIS — F17210 Nicotine dependence, cigarettes, uncomplicated: Secondary | ICD-10-CM | POA: Insufficient documentation

## 2014-05-30 DIAGNOSIS — N644 Mastodynia: Secondary | ICD-10-CM | POA: Insufficient documentation

## 2014-05-30 DIAGNOSIS — Z3200 Encounter for pregnancy test, result unknown: Secondary | ICD-10-CM | POA: Diagnosis present

## 2014-05-30 LAB — HCG, QUANTITATIVE, PREGNANCY: hCG, Beta Chain, Quant, S: <1 m[IU]/mL (ref <5)

## 2014-05-30 NOTE — Discharge Instructions (Signed)

## 2014-05-30 NOTE — MAU Note (Signed)
Urine in lab 

## 2014-05-30 NOTE — MAU Note (Addendum)
Missed depo, boobs were hurting real bad so took home test yesterday, was positive. Last depo was Nov 19

## 2014-05-30 NOTE — MAU Provider Note (Signed)
History     CSN: [id number]  Arrival date and time: 05/30/14 1549   None     Chief Complaint  Patient presents with  . Possible Pregnancy   HPI This is a 29 y.o. female who presents stating Dr Ellyn HackBovard sent her over for a blood pregnancy test. States had a faint positive line at home test. Has breast tenderness. Thinks she missed her Depo shot, but is actually due tomorrow for it.   RN Note: Missed depo, boobs were hurting real bad so took home test yesterday, was positive. Last depo was Nov 19          OB History    Gravida Para Term Preterm AB TAB SAB Ectopic Multiple Living   5 4 4  1  1   4       Past Medical History  Diagnosis Date  . History of chicken pox   . History of chlamydia     Past Surgical History  Procedure Laterality Date  . Breast surgery      2 llumps remosved in 2007  . Cholecystectomy  2010    Family History  Problem Relation Age of Onset  . Diabetes Maternal Grandmother   . Hypertension Maternal Grandmother   . Diabetes Paternal Grandmother   . Hypertension Paternal Grandmother   . Diabetes Father   . Cancer Paternal Aunt     History  Substance Use Topics  . Smoking status: Current Every Day Smoker -- 1.00 packs/day    Types: Cigarettes  . Smokeless tobacco: Never Used  . Alcohol Use: No    Allergies: No Known Allergies  Prescriptions prior to admission  Medication Sig Dispense Refill Last Dose  . albuterol (PROVENTIL HFA;VENTOLIN HFA) 108 (90 BASE) MCG/ACT inhaler Inhale 1-2 puffs into the lungs every 6 (six) hours as needed for wheezing. 1 Inhaler 0   . chlorpheniramine-HYDROcodone (TUSSIONEX PENNKINETIC ER) 10-8 MG/5ML LQCR Take 5 mLs by mouth every 12 (twelve) hours as needed for cough (Cough). 115 mL 0   . IBUPROFEN CHILDRENS PO Take 5 mLs by mouth every 6 (six) hours as needed (for pain).   02/02/2014 at Unknown time  . ipratropium (ATROVENT) 0.06 % nasal spray Place 2 sprays into both nostrils 4 (four) times daily. 15 mL  1   . oseltamivir (TAMIFLU) 75 MG capsule Take 1 capsule (75 mg total) by mouth every 12 (twelve) hours. Take all of medication. 10 capsule 0   . predniSONE (DELTASONE) 20 MG tablet Take 2 tablets (40 mg total) by mouth daily. Take 40 mg by mouth daily for 3 days, then 20mg  by mouth daily for 3 days, then 10mg  daily for 3 days 12 tablet 0     Review of Systems  Unable to perform ROS: other  Constitutional:       Patient left before test result came back   Physical Exam   Blood pressure 116/75, pulse 63, temperature 98.4 F (36.9 C), temperature source Oral, resp. rate 18, height 5\' 3"  (1.6 m), weight 149 lb (67.586 kg).  Physical Exam  Vitals reviewed. Constitutional:  Unable to perform exam, patient left after blood was drawn.     MAU Course  Procedures  MDM Results for orders placed or performed during the hospital encounter of 05/30/14 (from the past 24 hour(s))  hCG, quantitative, pregnancy     Status: None   Collection Time: 05/30/14  4:53 PM  Result Value Ref Range   hCG, Beta Chain, Quant, S <1 <5 mIU/mL  Assessment and Plan  A:  Negative pregnancy test      Breast tenderness  P;  Left before test result came back       I called her at home and notified her of result and that she still has time to get her depo Provera.       Followup in office  [facility] 05/30/2014, 6:12 PM

## 2014-05-30 NOTE — MAU Note (Signed)
Called pt back, informed she had left a note and wanted to be called with results of blood test

## 2014-06-24 ENCOUNTER — Encounter (HOSPITAL_COMMUNITY): Payer: Self-pay | Admitting: Emergency Medicine

## 2014-06-24 ENCOUNTER — Emergency Department (HOSPITAL_COMMUNITY)
Admission: EM | Admit: 2014-06-24 | Discharge: 2014-06-24 | Disposition: A | Discharge status: Home / Self Care | Payer: Medicaid Other | Attending: Emergency Medicine | Admitting: Emergency Medicine

## 2014-06-24 DIAGNOSIS — R55 Syncope and collapse: Secondary | ICD-10-CM | POA: Diagnosis not present

## 2014-06-24 DIAGNOSIS — Z3202 Encounter for pregnancy test, result negative: Secondary | ICD-10-CM | POA: Insufficient documentation

## 2014-06-24 DIAGNOSIS — Z720 Tobacco use: Secondary | ICD-10-CM | POA: Diagnosis not present

## 2014-06-24 DIAGNOSIS — Z79899 Other long term (current) drug therapy: Secondary | ICD-10-CM | POA: Diagnosis not present

## 2014-06-24 DIAGNOSIS — Z8619 Personal history of other infectious and parasitic diseases: Secondary | ICD-10-CM | POA: Diagnosis not present

## 2014-06-24 LAB — CBC
HCT: 40.8 % (ref 36.0–46.0)
HEMOGLOBIN: 13.7 g/dL (ref 12.0–15.0)
MCH: 31.8 pg (ref 26.0–34.0)
MCHC: 33.6 g/dL (ref 30.0–36.0)
MCV: 94.7 fL (ref 78.0–100.0)
Platelets: 268 10*3/uL (ref 150–400)
RBC: 4.31 MIL/uL (ref 3.87–5.11)
RDW: 12.9 % (ref 11.5–15.5)
WBC: 6.1 10*3/uL (ref 4.0–10.5)

## 2014-06-24 LAB — BASIC METABOLIC PANEL
ANION GAP: 10 (ref 5–15)
BUN: 7 mg/dL (ref 6–23)
CHLORIDE: 108 mmol/L (ref 96–112)
CO2: 24 mmol/L (ref 19–32)
Calcium: 9.4 mg/dL (ref 8.4–10.5)
Creatinine, Ser: 0.64 mg/dL (ref 0.50–1.10)
GFR calc Af Amer: >90 mL/min (ref >90)
GLUCOSE: 91 mg/dL (ref 70–99)
Potassium: 3.7 mmol/L (ref 3.5–5.1)
Sodium: 142 mmol/L (ref 135–145)

## 2014-06-24 LAB — CBG MONITORING, ED: Glucose-Capillary: 77 mg/dL (ref 70–99)

## 2014-06-24 LAB — POC URINE PREG, ED: PREG TEST UR: NEGATIVE

## 2014-06-24 NOTE — ED Notes (Signed)
Per pt, states she got dizzy at work and had to sit down-no dizziness now, just has H/A-no LOC

## 2014-06-24 NOTE — Discharge Instructions (Signed)
Follow up with your md in 1-2 weeks.  Return as needed

## 2014-06-25 NOTE — ED Provider Notes (Signed)
CSN: [id number]     Arrival date & time 06/24/14  1059 History   First MD Initiated Contact with Patient 06/24/14 1302     Chief Complaint  Patient presents with  . Near Syncope     (Consider location/radiation/quality/duration/timing/severity/associated sxs/prior Treatment) Patient is a 29 y.o. female presenting with near-syncope. The history is provided by the patient (the pt states she felt dizzy and almost passed out.  she did not ear any breakfast today).  Near Syncope This is a new problem. The current episode started less than 1 hour ago. The problem occurs rarely. The problem has been resolved. Pertinent negatives include no chest pain, no abdominal pain and no headaches. Nothing aggravates the symptoms. Nothing relieves the symptoms.    Past Medical History  Diagnosis Date  . History of chicken pox   . History of chlamydia    Past Surgical History  Procedure Laterality Date  . Breast surgery      2 llumps remosved in 2007  . Cholecystectomy  2010   Family History  Problem Relation Age of Onset  . Diabetes Maternal Grandmother   . Hypertension Maternal Grandmother   . Diabetes Paternal Grandmother   . Hypertension Paternal Grandmother   . Diabetes Father   . Cancer Paternal Aunt    History  Substance Use Topics  . Smoking status: Current Every Day Smoker -- 1.00 packs/day    Types: Cigarettes  . Smokeless tobacco: Never Used  . Alcohol Use: No   OB History    Gravida Para Term Preterm AB TAB SAB Ectopic Multiple Living   5 4 4  1  1   4      Review of Systems  Constitutional: Negative for appetite change and fatigue.  HENT: Negative for congestion, ear discharge and sinus pressure.   Eyes: Negative for discharge.  Respiratory: Negative for cough.   Cardiovascular: Positive for near-syncope. Negative for chest pain.  Gastrointestinal: Negative for abdominal pain and diarrhea.  Genitourinary: Negative for frequency and hematuria.  Musculoskeletal: Negative  for back pain.  Skin: Negative for rash.  Neurological: Negative for seizures and headaches.  Psychiatric/Behavioral: Negative for hallucinations.      Allergies  Review of patient's allergies indicates no known allergies.  Home Medications   Prior to Admission medications   Medication Sig Start Date End Date Taking? Authorizing Provider  albuterol (PROVENTIL HFA;VENTOLIN HFA) 108 (90 BASE) MCG/ACT inhaler Inhale 1-2 puffs into the lungs every 6 (six) hours as needed for wheezing. 02/02/14  Yes Garlon HatchetLisa M Sanders, PA-C  medroxyPROGESTERone (DEPO-PROVERA) 150 MG/ML injection Inject 150 mg into the muscle every 3 (three) months.   Yes Historical Provider, MD  chlorpheniramine-HYDROcodone (TUSSIONEX PENNKINETIC ER) 10-8 MG/5ML LQCR Take 5 mLs by mouth every 12 (twelve) hours as needed for cough (Cough). Patient not taking: Reported on 06/24/2014 02/02/14   Garlon HatchetLisa M Sanders, PA-C  ipratropium (ATROVENT) 0.06 % nasal spray Place 2 sprays into both nostrils 4 (four) times daily. Patient not taking: Reported on 06/24/2014 05/10/14   Linna HoffJames D Kindl, MD  oseltamivir (TAMIFLU) 75 MG capsule Take 1 capsule (75 mg total) by mouth every 12 (twelve) hours. Take all of medication. Patient not taking: Reported on 06/24/2014 05/10/14   Linna HoffJames D Kindl, MD  predniSONE (DELTASONE) 20 MG tablet Take 2 tablets (40 mg total) by mouth daily. Take 40 mg by mouth daily for 3 days, then 20mg  by mouth daily for 3 days, then 10mg  daily for 3 days Patient not taking: Reported on  06/24/2014 02/02/14   Garlon Hatchet, PA-C   BP 111/68 mmHg  Pulse 60  Temp(Src) 98.7 F (37.1 C) (Oral)  Resp 15  SpO2 98% Physical Exam  Constitutional: She is oriented to person, place, and time. She appears well-developed.  HENT:  Head: Normocephalic.  Eyes: Conjunctivae and EOM are normal. No scleral icterus.  Neck: Neck supple. No thyromegaly present.  Cardiovascular: Normal rate and regular rhythm.  Exam reveals no gallop and no friction  rub.   No murmur heard. Pulmonary/Chest: No stridor. She has no wheezes. She has no rales. She exhibits no tenderness.  Abdominal: She exhibits no distension. There is no tenderness. There is no rebound.  Musculoskeletal: Normal range of motion. She exhibits no edema.  Lymphadenopathy:    She has no cervical adenopathy.  Neurological: She is oriented to person, place, and time. She exhibits normal muscle tone. Coordination normal.  Skin: No rash noted. No erythema.  Psychiatric: She has a normal mood and affect. Her behavior is normal.    ED Course  Procedures (including critical care time) Labs Review Labs Reviewed  CBC  BASIC METABOLIC PANEL  CBG MONITORING, ED  POC URINE PREG, ED    Imaging Review No results found.   EKG Interpretation None      MDM   Final diagnoses:  Syncope, near    Near syncope,  resolved     Bethann Berkshire, MD 06/25/14 1536

## 2014-09-26 ENCOUNTER — Inpatient Hospital Stay (HOSPITAL_COMMUNITY)
Admission: AD | Admit: 2014-09-26 | Discharge: 2014-09-26 | Disposition: A | Discharge status: Home / Self Care | Payer: Self-pay | Source: Ambulatory Visit | Attending: Obstetrics & Gynecology | Admitting: Obstetrics & Gynecology

## 2014-09-26 DIAGNOSIS — F17210 Nicotine dependence, cigarettes, uncomplicated: Secondary | ICD-10-CM | POA: Insufficient documentation

## 2014-09-26 DIAGNOSIS — Z3202 Encounter for pregnancy test, result negative: Secondary | ICD-10-CM | POA: Insufficient documentation

## 2014-09-26 DIAGNOSIS — N912 Amenorrhea, unspecified: Secondary | ICD-10-CM | POA: Insufficient documentation

## 2014-09-26 LAB — POCT PREGNANCY, URINE: PREG TEST UR: NEGATIVE

## 2014-09-26 NOTE — MAU Note (Signed)
Pt had Depo due on May 16 did not get it thought she would do explanade  But did not get it . Had no period this month. Had negative pregnancy  Test.

## 2014-09-26 NOTE — Discharge Instructions (Signed)
Contraception Choices  Birth control (contraception) is the use of any methods or devices to stop pregnancy from happening. Below are some methods to help avoid pregnancy.  HORMONAL BIRTH CONTROL  · A small tube put under the skin of the upper arm (implant). The tube can stay in place for 3 years. The implant must be taken out after 3 years.  · Shots given every 3 months.  · Pills taken every day.  · Patches that are changed once a week.  · A ring put into the vagina (vaginal ring). The ring is left in place for 3 weeks and removed for 1 week. Then, a new ring is put in the vagina.  · Emergency birth control pills taken after unprotected sex (intercourse).  BARRIER BIRTH CONTROL   · A thin covering worn on the penis (female condom) during sex.  · A soft, loose covering put into the vagina (female condom) before sex.  · A rubber bowl that sits over the cervix (diaphragm). The bowl must be made for you. The bowl is put into the vagina before sex. The bowl is left in place for 6 to 8 hours after sex.  · A small, soft cup that fits over the cervix (cervical cap). The cup must be made for you. The cup can be left in place for 48 hours after sex.  · A sponge that is put into the vagina before sex.  · A chemical that kills or stops sperm from getting into the cervix and uterus (spermicide). The chemical may be a cream, jelly, foam, or pill.  INTRAUTERINE (IUD) BIRTH CONTROL   · IUD birth control is a small, T-shaped piece of plastic. The plastic is put inside the uterus. There are 2 types of IUD:  ¨ Copper IUD. The IUD is covered in copper wire. The copper makes a fluid that kills sperm. It can stay in place for 10 years.  ¨ Hormone IUD. The hormone stops pregnancy from happening. It can stay in place for 5 years.  PERMANENT METHODS  · When the woman has her fallopian tubes sealed, tied, or blocked during surgery. This stops the egg from traveling to the uterus.  · The doctor places a small coil or insert into each fallopian  tube. This causes scar tissue to form and blocks the fallopian tubes.  · When the female has the tubes that carry sperm tied off (vasectomy).  NATURAL FAMILY PLANNING BIRTH CONTROL   · Natural family planning means not having sex or using barrier birth control on the days the woman could become pregnant.  · Use a calendar to keep track of the length of each period and know the days she can get pregnant.  · Avoid sex during ovulation.  · Use a thermometer to measure body temperature. Also watch for symptoms of ovulation.  · Time sex to be after the woman has ovulated.  Use condoms to help protect yourself against sexually transmitted infections (STIs). Do this no matter what type of birth control you use. Talk to your doctor about which type of birth control is best for you.  Document Released: 01/24/2009 Document Revised: 04/03/2013 Document Reviewed: 10/18/2012  Exit[employer]® Patient Information ©2015 [employer]. This information is not intended to replace advice given to you by your health care provider. Make sure you discuss any questions you have with your health care provider.

## 2014-09-26 NOTE — MAU Provider Note (Signed)
History     CSN: [id number]  Arrival date and time: 09/26/14 1733   First Provider Initiated Contact with Patient 09/26/14 1752      Chief Complaint  Patient presents with  . Possible Pregnancy   HPI  Ms. [name] is a 29 y.o. [id number] who presents to MAU today with complaint of amenorrhea. The patient states that she was on Depo provera for birth control x 2 years. She missed her injection in May because of plans for Nexplanon, but never had Nexplanon placed. She has not had a period since. She states negative HPT. She denies abdominal pain, vaginal bleeding, discharge or fever.   OB History    Gravida Para Term Preterm AB TAB SAB Ectopic Multiple Living   5 4 4  1  1   4       Past Medical History  Diagnosis Date  . History of chicken pox   . History of chlamydia     Past Surgical History  Procedure Laterality Date  . Breast surgery      2 llumps remosved in 2007  . Cholecystectomy  2010    Family History  Problem Relation Age of Onset  . Diabetes Maternal Grandmother   . Hypertension Maternal Grandmother   . Diabetes Paternal Grandmother   . Hypertension Paternal Grandmother   . Diabetes Father   . Cancer Paternal Aunt     History  Substance Use Topics  . Smoking status: Current Every Day Smoker -- 1.00 packs/day    Types: Cigarettes  . Smokeless tobacco: Never Used  . Alcohol Use: No    Allergies: No Known Allergies  Prescriptions prior to admission  Medication Sig Dispense Refill Last Dose  . albuterol (PROVENTIL HFA;VENTOLIN HFA) 108 (90 BASE) MCG/ACT inhaler Inhale 1-2 puffs into the lungs every 6 (six) hours as needed for wheezing. 1 Inhaler 0 unknown  . chlorpheniramine-HYDROcodone (TUSSIONEX PENNKINETIC ER) 10-8 MG/5ML LQCR Take 5 mLs by mouth every 12 (twelve) hours as needed for cough (Cough). (Patient not taking: Reported on 06/24/2014) 115 mL 0 Not Taking at Unknown time  . ipratropium (ATROVENT) 0.06 % nasal spray Place 2 sprays into both  nostrils 4 (four) times daily. (Patient not taking: Reported on 06/24/2014) 15 mL 1 Not Taking at Unknown time  . medroxyPROGESTERone (DEPO-PROVERA) 150 MG/ML injection Inject 150 mg into the muscle every 3 (three) months.   06/09/2014  . oseltamivir (TAMIFLU) 75 MG capsule Take 1 capsule (75 mg total) by mouth every 12 (twelve) hours. Take all of medication. (Patient not taking: Reported on 06/24/2014) 10 capsule 0 Not Taking at Unknown time  . predniSONE (DELTASONE) 20 MG tablet Take 2 tablets (40 mg total) by mouth daily. Take 40 mg by mouth daily for 3 days, then 20mg  by mouth daily for 3 days, then 10mg  daily for 3 days (Patient not taking: Reported on 06/24/2014) 12 tablet 0 Not Taking at Unknown time    Review of Systems  Constitutional: Negative for fever and malaise/fatigue.  Gastrointestinal: Negative for abdominal pain.  Genitourinary:       Neg - vaginal bleeding, discharge   Physical Exam   Blood pressure 119/77, pulse 83, temperature 98.4 F (36.9 C), resp. rate 18, height 5\' 3"  (1.6 m), weight 165 lb 6.4 oz (75.025 kg).  Physical Exam  Nursing note and vitals reviewed. Constitutional: She is oriented to person, place, and time. She appears well-developed and well-nourished. No distress.  HENT:  Head: Normocephalic and atraumatic.  Cardiovascular: Normal  rate.   Respiratory: Effort normal.  GI: Soft.  Neurological: She is alert and oriented to person, place, and time.  Skin: Skin is warm and dry. No erythema.  Psychiatric: She has a normal mood and affect.   Results for orders placed or performed during the hospital encounter of 09/26/14 (from the past 24 hour(s))  Pregnancy, urine POC     Status: None   Collection Time: 09/26/14  6:06 PM  Result Value Ref Range   Preg Test, Ur NEGATIVE NEGATIVE    MAU Course  Procedures  None  MDM UPT  Discussed patient with Dr. Ellyn Hack. Agrees with plan to discharge and have follow-up in the office for birth control initiation.    Assessment and Plan  A: Negative pregnancy test  P: Discharge home Condoms advised Patient advised to follow-up with Dr. Ellyn Hack for birth control initiation Patient may return to MAU as needed or if her condition were to change or worsen  Marny Lowenstein, PA-C  09/26/2014, 6:12 PM

## 2014-11-18 ENCOUNTER — Inpatient Hospital Stay (HOSPITAL_COMMUNITY)
Admission: AD | Admit: 2014-11-18 | Discharge: 2014-11-19 | Disposition: A | Discharge status: Home / Self Care | Payer: Self-pay | Source: Ambulatory Visit | Attending: Obstetrics and Gynecology | Admitting: Obstetrics and Gynecology

## 2014-11-18 ENCOUNTER — Encounter (HOSPITAL_COMMUNITY): Payer: Self-pay | Admitting: *Deleted

## 2014-11-18 DIAGNOSIS — N926 Irregular menstruation, unspecified: Secondary | ICD-10-CM | POA: Insufficient documentation

## 2014-11-18 DIAGNOSIS — N939 Abnormal uterine and vaginal bleeding, unspecified: Secondary | ICD-10-CM | POA: Insufficient documentation

## 2014-11-18 DIAGNOSIS — F17210 Nicotine dependence, cigarettes, uncomplicated: Secondary | ICD-10-CM | POA: Insufficient documentation

## 2014-11-18 LAB — URINE MICROSCOPIC-ADD ON

## 2014-11-18 LAB — CBC
HEMATOCRIT: 38 % (ref 36.0–46.0)
Hemoglobin: 13.3 g/dL (ref 12.0–15.0)
MCH: 32 pg (ref 26.0–34.0)
MCHC: 35 g/dL (ref 30.0–36.0)
MCV: 91.6 fL (ref 78.0–100.0)
PLATELETS: 247 10*3/uL (ref 150–400)
RBC: 4.15 MIL/uL (ref 3.87–5.11)
RDW: 12.7 % (ref 11.5–15.5)
WBC: 7.9 10*3/uL (ref 4.0–10.5)

## 2014-11-18 LAB — URINALYSIS, ROUTINE W REFLEX MICROSCOPIC
GLUCOSE, UA: NEGATIVE mg/dL
Ketones, ur: 15 mg/dL — AB
LEUKOCYTES UA: NEGATIVE
Nitrite: NEGATIVE
PH: 7 (ref 5.0–8.0)
Protein, ur: 30 mg/dL — AB
Specific Gravity, Urine: 1.015 (ref 1.005–1.030)
Urobilinogen, UA: 1 mg/dL (ref 0.0–1.0)

## 2014-11-18 LAB — POCT PREGNANCY, URINE: Preg Test, Ur: NEGATIVE

## 2014-11-18 NOTE — MAU Note (Signed)
Pt reports she stopped Depo in May and had a normal period x 2. Today she started bleeding and has changed a tampon q 1 hour. Lower abd cramping.

## 2014-11-18 NOTE — MAU Provider Note (Signed)
History     CSN: [id number]  Arrival date and time: 11/18/14 2209   First Provider Initiated Contact with Patient 11/18/14 2332      No chief complaint on file.  HPI Comments: [name] is a 29 y.o. [id number] who presents today with vaginal bleeding. She states that she was due for depo in May and did not have it. She had a normal period in June and spotting in July and then this episode of bleeding. She states that she was on depo for about a year and half. She stopped due to weight gain. She is not planning a pregnancy, and has not started a new birth control method at this time.   Vaginal Bleeding The patient's primary symptoms include vaginal bleeding. This is a new problem. The current episode started today. The problem occurs constantly. The problem has been unchanged. The pain is moderate (6/10). The problem affects both sides. She is not pregnant. Associated symptoms include abdominal pain. Pertinent negatives include no constipation, diarrhea, dysuria, fever, frequency, nausea, urgency or vomiting. The vaginal discharge was bloody. The vaginal bleeding is heavier than menses. She has been passing clots (pea sized ). Nothing aggravates the symptoms. She has tried nothing for the symptoms. She uses nothing for contraception.    Past Medical History  Diagnosis Date  . History of chicken pox   . History of chlamydia     Past Surgical History  Procedure Laterality Date  . Breast surgery      2 llumps remosved in 2007  . Cholecystectomy  2010    Family History  Problem Relation Age of Onset  . Diabetes Maternal Grandmother   . Hypertension Maternal Grandmother   . Diabetes Paternal Grandmother   . Hypertension Paternal Grandmother   . Diabetes Father   . Cancer Paternal Aunt     History  Substance Use Topics  . Smoking status: Current Every Day Smoker -- 1.00 packs/day    Types: Cigarettes  . Smokeless tobacco: Never Used  . Alcohol Use: No    Allergies: No Known  Allergies  Prescriptions prior to admission  Medication Sig Dispense Refill Last Dose  . albuterol (PROVENTIL HFA;VENTOLIN HFA) 108 (90 BASE) MCG/ACT inhaler Inhale 1-2 puffs into the lungs every 6 (six) hours as needed for wheezing. 1 Inhaler 0 unknown  . chlorpheniramine-HYDROcodone (TUSSIONEX PENNKINETIC ER) 10-8 MG/5ML LQCR Take 5 mLs by mouth every 12 (twelve) hours as needed for cough (Cough). (Patient not taking: Reported on 06/24/2014) 115 mL 0 Not Taking at Unknown time  . ipratropium (ATROVENT) 0.06 % nasal spray Place 2 sprays into both nostrils 4 (four) times daily. (Patient not taking: Reported on 06/24/2014) 15 mL 1 Not Taking at Unknown time  . medroxyPROGESTERone (DEPO-PROVERA) 150 MG/ML injection Inject 150 mg into the muscle every 3 (three) months.   More than a month at Unknown time  . oseltamivir (TAMIFLU) 75 MG capsule Take 1 capsule (75 mg total) by mouth every 12 (twelve) hours. Take all of medication. (Patient not taking: Reported on 06/24/2014) 10 capsule 0 Not Taking at Unknown time  . predniSONE (DELTASONE) 20 MG tablet Take 2 tablets (40 mg total) by mouth daily. Take 40 mg by mouth daily for 3 days, then 20mg  by mouth daily for 3 days, then 10mg  daily for 3 days (Patient not taking: Reported on 06/24/2014) 12 tablet 0 Not Taking at Unknown time    Review of Systems  Constitutional: Negative for fever.  Gastrointestinal: Positive for abdominal pain.  Negative for nausea, vomiting, diarrhea and constipation.  Genitourinary: Positive for vaginal bleeding. Negative for dysuria, urgency and frequency.   Physical Exam   Blood pressure 109/72, pulse 64, temperature 98.4 F (36.9 C), temperature source Oral, resp. rate 16, height  (1.6 m), weight 73.936 kg (163 lb), last menstrual period 11/18/2014, SpO2 98 %.  Physical Exam  Nursing note and vitals reviewed. Constitutional: She is oriented to person, place, and time. She appears well-developed and well-nourished. No  distress.  HENT:  Head: Normocephalic.  Cardiovascular: Normal rate.   Respiratory: Effort normal.  GI: Soft. There is no tenderness. There is no rebound.  Genitourinary:   External: no lesion Vagina: moderate amount of blood seen  Cervix: pink, smooth, no CMT Uterus: NSSC Adnexa: NT   Neurological: She is alert and oriented to person, place, and time.  Skin: Skin is warm and dry.  Psychiatric: She has a normal mood and affect.   Results for orders placed or performed during the hospital encounter of 11/18/14 (from the past 24 hour(s))  Urinalysis, Routine w reflex microscopic (not at [facility])     Status: Abnormal   Collection Time: 11/18/14 10:29 PM  Result Value Ref Range   Color, Urine YELLOW YELLOW   APPearance TURBID (A) CLEAR   Specific Gravity, Urine 1.015 1.005 - 1.030   pH 7.0 5.0 - 8.0   Glucose, UA NEGATIVE NEGATIVE mg/dL   Hgb urine dipstick LARGE (A) NEGATIVE   Bilirubin Urine SMALL (A) NEGATIVE   Ketones, ur 15 (A) NEGATIVE mg/dL   Protein, ur 30 (A) NEGATIVE mg/dL   Urobilinogen, UA 1.0 0.0 - 1.0 mg/dL   Nitrite NEGATIVE NEGATIVE   Leukocytes, UA NEGATIVE NEGATIVE  Urine microscopic-add on     Status: Abnormal   Collection Time: 11/18/14 10:29 PM  Result Value Ref Range   Squamous Epithelial / LPF RARE RARE   WBC, UA 0-2 <3 WBC/hpf   RBC / HPF TOO NUMEROUS TO COUNT <3 RBC/hpf   Bacteria, UA FEW (A) RARE  Pregnancy, urine POC     Status: None   Collection Time: 11/18/14 11:00 PM  Result Value Ref Range   Preg Test, Ur NEGATIVE NEGATIVE  CBC     Status: None   Collection Time: 11/18/14 11:34 PM  Result Value Ref Range   WBC 7.9 4.0 - 10.5 K/uL   RBC 4.15 3.87 - 5.11 MIL/uL   Hemoglobin 13.3 12.0 - 15.0 g/dL   HCT 40.9 81.1 - 91.4 %   MCV 91.6 78.0 - 100.0 fL   MCH 32.0 26.0 - 34.0 pg   MCHC 35.0 30.0 - 36.0 g/dL   RDW 78.2 95.6 - 21.3 %   Platelets 247 150 - 400 K/uL  Wet prep, genital     Status: None   Collection Time: 11/18/14 11:49 PM  Result  Value Ref Range   Yeast Wet Prep HPF POC NONE SEEN NONE SEEN   Trich, Wet Prep NONE SEEN NONE SEEN   Clue Cells Wet Prep HPF POC NONE SEEN NONE SEEN   WBC, Wet Prep HPF POC NONE SEEN NONE SEEN    MAU Course  Procedures  MDM 0012: D/W Dr. Ellyn Hack, ok for dc home.   Assessment and Plan   1. Irregular menstrual cycle   2. Abnormal uterine bleeding    DC home Comfort measures reviewed  Bleeding precautions RX: none  Return to MAU as needed FU with OB as planned  Follow-up Information    Follow  up with Sherian Rein, MD.   Specialty:  Obstetrics and Gynecology   Why:  If symptoms worsen   Contact information:   [address] [place] [address] [phone]         Tawnya Crook 11/18/2014, 11:38 PM

## 2014-11-19 DIAGNOSIS — N926 Irregular menstruation, unspecified: Secondary | ICD-10-CM

## 2014-11-19 LAB — WET PREP, GENITAL
CLUE CELLS WET PREP: NONE SEEN
Trich, Wet Prep: NONE SEEN
WBC, Wet Prep HPF POC: NONE SEEN
YEAST WET PREP: NONE SEEN

## 2014-11-19 LAB — GC/CHLAMYDIA PROBE AMP (~~LOC~~) NOT AT ARMC
Chlamydia: NEGATIVE
Neisseria Gonorrhea: NEGATIVE

## 2014-11-19 LAB — HIV ANTIBODY (ROUTINE TESTING W REFLEX): HIV SCREEN 4TH GENERATION: NONREACTIVE

## 2014-11-19 NOTE — Discharge Instructions (Signed)
Abnormal Uterine Bleeding Abnormal uterine bleeding can affect women at various stages in life, including teenagers, women in their reproductive years, pregnant women, and women who have reached menopause. Several kinds of uterine bleeding are considered abnormal, including:  Bleeding or spotting between periods.   Bleeding after sexual intercourse.   Bleeding that is heavier or more than normal.   Periods that last longer than usual.  Bleeding after menopause.  Many cases of abnormal uterine bleeding are minor and simple to treat, while others are more serious. Any type of abnormal bleeding should be evaluated by your health care provider. Treatment will depend on the cause of the bleeding. HOME CARE INSTRUCTIONS Monitor your condition for any changes. The following actions may help to alleviate any discomfort you are experiencing:  Avoid the use of tampons and douches as directed by your health care provider.  Change your pads frequently. You should get regular pelvic exams and Pap tests. Keep all follow-up appointments for diagnostic tests as directed by your health care provider.  SEEK MEDICAL CARE IF:   Your bleeding lasts more than 1 week.   You feel dizzy at times.  SEEK IMMEDIATE MEDICAL CARE IF:   You pass out.   You are changing pads every 15 to 30 minutes.   You have abdominal pain.  You have a fever.   You become sweaty or weak.   You are passing large blood clots from the vagina.   You start to feel nauseous and vomit. MAKE SURE YOU:   Understand these instructions.  Will watch your condition.  Will get help right away if you are not doing well or get worse. Document Released: 03/29/2005 Document Revised: 04/03/2013 Document Reviewed: 10/26/2012 [employer] Patient Information 2015 [employer]. This information is not intended to replace advice given to you by your health care provider. Make sure you discuss any questions you have with your  health care provider.  

## 2014-12-30 ENCOUNTER — Encounter (HOSPITAL_COMMUNITY): Payer: Self-pay

## 2014-12-30 ENCOUNTER — Emergency Department (HOSPITAL_COMMUNITY)
Admission: EM | Admit: 2014-12-30 | Discharge: 2014-12-30 | Disposition: A | Discharge status: Home / Self Care | Payer: Medicaid Other | Attending: Emergency Medicine | Admitting: Emergency Medicine

## 2014-12-30 DIAGNOSIS — Z8619 Personal history of other infectious and parasitic diseases: Secondary | ICD-10-CM | POA: Insufficient documentation

## 2014-12-30 DIAGNOSIS — Z720 Tobacco use: Secondary | ICD-10-CM | POA: Insufficient documentation

## 2014-12-30 DIAGNOSIS — M5416 Radiculopathy, lumbar region: Secondary | ICD-10-CM | POA: Insufficient documentation

## 2014-12-30 MED ORDER — IBUPROFEN 800 MG PO TABS
800.0000 mg | ORAL_TABLET | Freq: Once | ORAL | Status: AC
  Administered 2014-12-30: 800 mg via ORAL
  Filled 2014-12-30: qty 1

## 2014-12-30 MED ORDER — IBUPROFEN 800 MG PO TABS: 800.0000 mg | ORAL_TABLET | Freq: Three times a day (TID) | ORAL | Status: DC

## 2014-12-30 NOTE — ED Provider Notes (Signed)
CSN: [id number]     Arrival date & time 12/30/14  4098 History   First MD Initiated Contact with Patient 12/30/14 0740     Chief Complaint  Patient presents with  . Back Pain     (Consider location/radiation/quality/duration/timing/severity/associated sxs/prior Treatment) HPI Complains of left-sided low back pain radiating to left knee onset 3 weeks ago becoming worse last night after she lifted a load of laundry. Pain is improved and she lies still worse when she changes position no treatment prior to coming here no loss of bladder bowel control no urinary symptoms no fever. She had similar pain 3 years ago, at that time was treated with anti-inflammatory cell and pain resolved. No other associated symptoms. Pain is moderate at present. Past Medical History  Diagnosis Date  . History of chicken pox   . History of chlamydia    Past Surgical History  Procedure Laterality Date  . Breast surgery      2 llumps remosved in 2007  . Cholecystectomy  2010   Family History  Problem Relation Age of Onset  . Diabetes Maternal Grandmother   . Hypertension Maternal Grandmother   . Diabetes Paternal Grandmother   . Hypertension Paternal Grandmother   . Diabetes Father   . Cancer Paternal Aunt    Social History  Substance Use Topics  . Smoking status: Current Every Day Smoker -- 1.00 packs/day    Types: Cigarettes  . Smokeless tobacco: Never Used  . Alcohol Use: No   OB History    Gravida Para Term Preterm AB TAB SAB Ectopic Multiple Living   Review of Systems  Constitutional: Negative.   HENT: Negative.   Respiratory: Negative.   Cardiovascular: Negative.        Ankle swelling for several weeks  Gastrointestinal: Negative.   Genitourinary:       Last normal menstrual period September 9,2016  Musculoskeletal: Positive for back pain.  Skin: Negative.   Neurological: Negative.   Psychiatric/Behavioral: Negative.   All other systems reviewed and are  negative.     Allergies  Review of patient's allergies indicates no known allergies.  Home Medications   Prior to Admission medications   Medication Sig Start Date End Date Taking? Authorizing Provider  albuterol (PROVENTIL HFA;VENTOLIN HFA) 108 (90 BASE) MCG/ACT inhaler Inhale 1-2 puffs into the lungs every 6 (six) hours as needed for wheezing. Patient not taking: Reported on 12/30/2014 02/02/14   Garlon Hatchet, PA-C   BP 117/77 mmHg  Pulse 83  Temp(Src) 98.1 F (36.7 C) (Oral)  Resp 18  SpO2 98%  LMP 12/20/2014 Physical Exam  Constitutional: She is oriented to person, place, and time. She appears well-developed and well-nourished. No distress.  HENT:  Head: Normocephalic and atraumatic.  Eyes: Conjunctivae are normal. Pupils are equal, round, and reactive to light.  Neck: Neck supple. No tracheal deviation present. No thyromegaly present.  Cardiovascular: Normal rate and regular rhythm.   No murmur heard. Pulmonary/Chest: Effort normal and breath sounds normal.  Abdominal: Soft. Bowel sounds are normal. She exhibits no distension. There is no tenderness.  Musculoskeletal: Normal range of motion. She exhibits no edema or tenderness.  Entire spine nontender. She has pain at left paralumbar area when she sits up from a supine position.. No leg edema or ankle edema  Neurological: She is alert and oriented to person, place, and time. No cranial nerve deficit. Coordination normal.  Gait normal  motor strength 5 over 5 overall DTRs symmetric bilaterally knee jerk and ankle jerk biceps was downward going bilaterally  Skin: Skin is warm and dry. No rash noted.  Psychiatric: She has a normal mood and affect.  Nursing note and vitals reviewed.   ED Course  Procedures (including critical care time) Labs Review Labs Reviewed - No data to display  Imaging Review No results found. I have personally reviewed and evaluated these images and lab results as part of my medical  decision-making.   EKG Interpretation None      MDM  Imaging not indicated discussed with patient who agrees Plan prescription ibuprofen. Follow-up with PMD if not better in several days. Patient told avoid salt which will help with ankle edema Diagnosis lumbar radiculopathy Final diagnoses:  None        Doug Sou, MD 12/30/14 [phone]

## 2014-12-30 NOTE — Discharge Instructions (Signed)
Back Pain, Adult Call Dr. Jeannetta Nap scheduled appointment if not improved in 4 or 5 days. Return if her condition worsens for any reason Back pain is very common. The pain often gets better over time. The cause of back pain is usually not dangerous. Most people can learn to manage their back pain on their own.  HOME CARE   Stay active. Start with short walks on flat ground if you can. Try to walk farther each day.  Do not sit, drive, or stand in one place for more than 30 minutes. Do not stay in bed.  Do not avoid exercise or work. Activity can help your back heal faster.  Be careful when you bend or lift an object. Bend at your knees, keep the object close to you, and do not twist.  Sleep on a firm mattress. Lie on your side, and bend your knees. If you lie on your back, put a pillow under your knees.  Only take medicines as told by your doctor.  Put ice on the injured area.  Put ice in a plastic bag.  Place a towel between your skin and the bag.  Leave the ice on for 15-20 minutes, 03-04 times a day for the first 2 to 3 days. After that, you can switch between ice and heat packs.  Ask your doctor about back exercises or massage.  Avoid feeling anxious or stressed. Find good ways to deal with stress, such as exercise. GET HELP RIGHT AWAY IF:   Your pain does not go away with rest or medicine.  Your pain does not go away in 1 week.  You have new problems.  You do not feel well.  The pain spreads into your legs.  You cannot control when you poop (bowel movement) or pee (urinate).  Your arms or legs feel weak or lose feeling (numbness).  You feel sick to your stomach (nauseous) or throw up (vomit).  You have belly (abdominal) pain.  You feel like you may pass out (faint). MAKE SURE YOU:   Understand these instructions.  Will watch your condition.  Will get help right away if you are not doing well or get worse. Document Released: 09/15/2007 Document Revised:  06/21/2011 Document Reviewed: 07/31/2013 [facility] Patient Information 2015 [place], [place]. This information is not intended to replace advice given to you by your health care provider. Make sure you discuss any questions you have with your health care provider.

## 2014-12-30 NOTE — ED Notes (Signed)
Pt states back pain x 3 weeks.  Lifts heavy objects at work.  No change in urination.  No vaginal discharge.  Pain is to left back.  Pain rotates down to left knee.

## 2015-03-01 ENCOUNTER — Emergency Department (HOSPITAL_COMMUNITY)
Admission: EM | Admit: 2015-03-01 | Discharge: 2015-03-02 | Disposition: A | Discharge status: Home / Self Care | Payer: Medicaid Other | Attending: Emergency Medicine | Admitting: Emergency Medicine

## 2015-03-01 ENCOUNTER — Encounter (HOSPITAL_COMMUNITY): Payer: Self-pay | Admitting: Emergency Medicine

## 2015-03-01 DIAGNOSIS — R1030 Lower abdominal pain, unspecified: Secondary | ICD-10-CM | POA: Insufficient documentation

## 2015-03-01 DIAGNOSIS — Z3202 Encounter for pregnancy test, result negative: Secondary | ICD-10-CM | POA: Insufficient documentation

## 2015-03-01 DIAGNOSIS — F17210 Nicotine dependence, cigarettes, uncomplicated: Secondary | ICD-10-CM | POA: Insufficient documentation

## 2015-03-01 DIAGNOSIS — R631 Polydipsia: Secondary | ICD-10-CM | POA: Insufficient documentation

## 2015-03-01 DIAGNOSIS — R630 Anorexia: Secondary | ICD-10-CM | POA: Insufficient documentation

## 2015-03-01 DIAGNOSIS — R42 Dizziness and giddiness: Secondary | ICD-10-CM | POA: Insufficient documentation

## 2015-03-01 DIAGNOSIS — Z8619 Personal history of other infectious and parasitic diseases: Secondary | ICD-10-CM | POA: Insufficient documentation

## 2015-03-01 LAB — URINALYSIS, ROUTINE W REFLEX MICROSCOPIC
Bilirubin Urine: NEGATIVE
Glucose, UA: NEGATIVE mg/dL
Hgb urine dipstick: NEGATIVE
KETONES UR: NEGATIVE mg/dL
LEUKOCYTES UA: NEGATIVE
NITRITE: NEGATIVE
PH: 6.5 (ref 5.0–8.0)
Protein, ur: NEGATIVE mg/dL
Specific Gravity, Urine: 1.005 (ref 1.005–1.030)

## 2015-03-01 LAB — COMPREHENSIVE METABOLIC PANEL
ALK PHOS: 79 U/L (ref 38–126)
ALT: 15 U/L (ref 14–54)
AST: 22 U/L (ref 15–41)
Albumin: 4.2 g/dL (ref 3.5–5.0)
Anion gap: 9 (ref 5–15)
BILIRUBIN TOTAL: 0.4 mg/dL (ref 0.3–1.2)
BUN: 6 mg/dL (ref 6–20)
CO2: 24 mmol/L (ref 22–32)
Calcium: 9.1 mg/dL (ref 8.9–10.3)
Chloride: 104 mmol/L (ref 101–111)
Creatinine, Ser: 0.59 mg/dL (ref 0.44–1.00)
GFR calc Af Amer: >60 mL/min (ref >60)
GLUCOSE: 85 mg/dL (ref 65–99)
POTASSIUM: 3.3 mmol/L — AB (ref 3.5–5.1)
Sodium: 137 mmol/L (ref 135–145)
TOTAL PROTEIN: 7.2 g/dL (ref 6.5–8.1)

## 2015-03-01 LAB — CBC
HCT: 38.7 % (ref 36.0–46.0)
HEMOGLOBIN: 13.6 g/dL (ref 12.0–15.0)
MCH: 31.6 pg (ref 26.0–34.0)
MCHC: 35.1 g/dL (ref 30.0–36.0)
MCV: 90 fL (ref 78.0–100.0)
Platelets: 268 10*3/uL (ref 150–400)
RBC: 4.3 MIL/uL (ref 3.87–5.11)
RDW: 12.4 % (ref 11.5–15.5)
WBC: 9 10*3/uL (ref 4.0–10.5)

## 2015-03-01 LAB — WET PREP, GENITAL
SPERM: NONE SEEN
Trich, Wet Prep: NONE SEEN
YEAST WET PREP: NONE SEEN

## 2015-03-01 LAB — LIPASE, BLOOD: Lipase: 23 U/L (ref 11–51)

## 2015-03-01 LAB — CBG MONITORING, ED: Glucose-Capillary: 81 mg/dL (ref 65–99)

## 2015-03-01 LAB — POC URINE PREG, ED: Preg Test, Ur: NEGATIVE

## 2015-03-01 MED ORDER — ONDANSETRON HCL 4 MG/2ML IJ SOLN
4.0000 mg | Freq: Once | INTRAMUSCULAR | Status: AC
  Administered 2015-03-01: 4 mg via INTRAVENOUS
  Filled 2015-03-01: qty 2

## 2015-03-01 MED ORDER — SODIUM CHLORIDE 0.9 % IV BOLUS (SEPSIS)
1000.0000 mL | Freq: Once | INTRAVENOUS | Status: AC
  Administered 2015-03-01: 1000 mL via INTRAVENOUS

## 2015-03-01 MED ORDER — POTASSIUM CHLORIDE CRYS ER 20 MEQ PO TBCR
40.0000 meq | EXTENDED_RELEASE_TABLET | Freq: Once | ORAL | Status: AC
  Administered 2015-03-01: 40 meq via ORAL
  Filled 2015-03-01: qty 2

## 2015-03-01 NOTE — ED Notes (Signed)
Pt attempting to provide a urine sample.

## 2015-03-01 NOTE — ED Notes (Signed)
Pt from home c/o weakness since yesterday and lower abdominal pain. Denies urinary symptoms. LMP was NOV.5.

## 2015-03-01 NOTE — ED Provider Notes (Signed)
CSN: [id number]     Arrival date & time 03/01/15  2059 History  By signing my name below, I, Joanne Martinva Garza, attest that this documentation has been prepared under the direction and in the presence of [facility] Angelisse Riso, PA-C. Electronically Signed: Doreatha MartinEva Garza, ED Scribe. 03/01/2015. 9:33 PM.    Chief Complaint  Patient presents with  . Weakness  . Abdominal Pain   The history is provided by the patient and medical records. No language interpreter was used.    HPI Comments: [name] is a 29 y.o. female with no chronic medical conditions who presents to the Emergency Department complaining of moderate lightheadedness (without dizziness) onset yesterday with associated polydipsia, suprapubic abdominal pressure. Pt reports a decreased appetite yesterday that resolved temporarily until this evening. Pt states she has been drinking mountain dew and water without relief of thirst. Pt admits to drinking 3 L of mountain dew and 2 20oz Monster energy drinks daily. Pt is sexually active and is not on oral contraceptives. LMP was the 5th of this month and ended 2 days early. No sick contact. No recent travel outside of the country. NKDA. No daily medications. She denies dysuria, vaginal discharge, nausea, vomiting, diarrhea, fever, otalgia, sinus congestion, sore throat.  [id number]   Past Medical History  Diagnosis Date  . History of chicken pox   . History of chlamydia    Past Surgical History  Procedure Laterality Date  . Breast surgery      2 llumps remosved in 2007  . Cholecystectomy  2010   Family History  Problem Relation Age of Onset  . Diabetes Maternal Grandmother   . Hypertension Maternal Grandmother   . Diabetes Paternal Grandmother   . Hypertension Paternal Grandmother   . Diabetes Father   . Cancer Paternal Aunt    Social History  Substance Use Topics  . Smoking status: Current Every Day Smoker -- 1.00 packs/day    Types: Cigarettes  . Smokeless tobacco: Never Used  .  Alcohol Use: No   OB History    Gravida Para Term Preterm AB TAB SAB Ectopic Multiple Living   5 4 4  1  1   4      Review of Systems  Constitutional: Positive for appetite change. Negative for fever, diaphoresis, fatigue and unexpected weight change.  HENT: Negative for congestion, ear pain, mouth sores and sore throat.   Eyes: Negative for visual disturbance.  Respiratory: Negative for cough, chest tightness, shortness of breath and wheezing.   Cardiovascular: Negative for chest pain.  Gastrointestinal: Positive for abdominal pain. Negative for nausea, vomiting, diarrhea and constipation.  Endocrine: Positive for polydipsia. Negative for polyphagia and polyuria.  Genitourinary: Negative for dysuria, urgency, frequency, hematuria, vaginal bleeding and vaginal discharge.  Musculoskeletal: Negative for back pain and neck stiffness.  Skin: Negative for rash.  Allergic/Immunologic: Negative for immunocompromised state.  Neurological: Positive for light-headedness. Negative for syncope and headaches.  Hematological: Does not bruise/bleed easily.  Psychiatric/Behavioral: Negative for sleep disturbance. The patient is not nervous/anxious.    Allergies  Review of patient's allergies indicates no known allergies.  Home Medications   Prior to Admission medications   Not on File   BP 115/85 mmHg  Pulse 81  Temp(Src) 98.6 F (37 C) (Oral)  Resp 16  Ht 5\' 3"  (1.6 m)  SpO2 100%  LMP 02/15/2015 Physical Exam  Constitutional: She appears well-developed and well-nourished. No distress.  Awake, alert, nontoxic appearance  HENT:  Head: Normocephalic and atraumatic.  Mouth/Throat: Oropharynx is  clear and moist. No oropharyngeal exudate.  Eyes: Conjunctivae are normal. No scleral icterus.  Neck: Normal range of motion. Neck supple.  Cardiovascular: Normal rate, regular rhythm, normal heart sounds and intact distal pulses.   No murmur heard. Pulmonary/Chest: Effort normal and breath sounds  normal. No respiratory distress. She has no wheezes. She has no rales. She exhibits no tenderness.  Equal chest expansion. Clear and equal breath sounds bilaterally without focal rhonchi, wheezes or rales.   Abdominal: Soft. Bowel sounds are normal. She exhibits no mass. There is tenderness. There is no rebound and no guarding. Hernia confirmed negative in the right inguinal area and confirmed negative in the left inguinal area.  TTP of the suprapubic region without guarding or rebound. No CVA tenderness.   Genitourinary: Uterus normal. No labial fusion. There is no rash, tenderness or lesion on the right labia. There is no rash, tenderness or lesion on the left labia. Uterus is not deviated, not enlarged, not fixed and not tender. Cervix exhibits no motion tenderness, no discharge and no friability. Right adnexum displays no mass, no tenderness and no fullness. Left adnexum displays no mass, no tenderness and no fullness. No erythema, tenderness or bleeding in the vagina. No foreign body around the vagina. No signs of injury around the vagina. No vaginal discharge found.  Normal female external genitalia. No cervical motion tenderness or adnexal tenderness. No discharge present. No masses. Chaperone present throughout entire exam.   Musculoskeletal: Normal range of motion. She exhibits no edema.  Lymphadenopathy:       Right: No inguinal adenopathy present.       Left: No inguinal adenopathy present.  Neurological: She is alert.  Speech is clear and goal oriented Moves extremities without ataxia  Skin: Skin is warm and dry. She is not diaphoretic. No erythema.  Psychiatric: She has a normal mood and affect.  Nursing note and vitals reviewed.  ED Course  Procedures (including critical care time) DIAGNOSTIC STUDIES: Oxygen Saturation is 100% on RA, normal by my interpretation.    COORDINATION OF CARE: 9:27 PM Discussed treatment plan with pt at bedside and pt agreed to plan.   Labs  Review Labs Reviewed  WET PREP, GENITAL - Abnormal; Notable for the following:    Clue Cells Wet Prep HPF POC PRESENT (*)    WBC, Wet Prep HPF POC MODERATE (*)    All other components within normal limits  COMPREHENSIVE METABOLIC PANEL - Abnormal; Notable for the following:    Potassium 3.3 (*)    All other components within normal limits  URINALYSIS, ROUTINE W REFLEX MICROSCOPIC (NOT AT [facility]) - Abnormal; Notable for the following:    APPearance CLOUDY (*)    All other components within normal limits  LIPASE, BLOOD  CBC  RPR  HIV ANTIBODY (ROUTINE TESTING)  POC URINE PREG, ED  CBG MONITORING, ED  GC/CHLAMYDIA PROBE AMP ([place]) NOT AT [facility]   I have personally reviewed and evaluated these lab results as part of my medical decision-making.  MDM   Final diagnoses:  Lightheadedness  Lower abdominal pain    Brighten Garza presents with lightheadedness, suprapubic abd pressure and general feelings of illness.  Pt endorses large amounts of caffeine intake without recent change.  No dysuria.  No orthostasis and no vertiginous symptoms.  Abd without rebound or guarding.    Orthostatic VS for the past 24 hrs:  BP- Lying Pulse- Lying BP- Sitting Pulse- Sitting BP- Standing at 0 minutes Pulse- Standing at 0  minutes  03/01/15 2218 105/65 mmHg 69 110/81 mmHg 85 118/82 mmHg 89    Pt with mild hypokalemia, repleated in the department.  Pt given fluid bolus with resolution of her lightheadedness.  UA and ends of urinary tract infection.  Wet prep with a few clue cells and moderate white blood cells but no cervical motion tenderness or adnexal tenderness.  Discussed with patient ST cultures that are pending. Moderate white blood cells on wet prep. Will not treat prophylactically as she denies being at risk for contracting an STD.  Abdomen soft and nontender on repeat exam.  BP 115/85 mmHg  Pulse 81  Temp(Src) 98.6 F (37 C) (Oral)  Resp 16  Ht  (1.6 m)  SpO2 100%  LMP  02/15/2015  I personally performed the services described in this documentation, which was scribed in my presence. The recorded information has been reviewed and is accurate.    Dahlia Client Kylan Veach, PA-C 03/02/15 0002  Benjiman Core, MD 03/02/15 1520

## 2015-03-01 NOTE — Discharge Instructions (Signed)
1. Medications: usual home medications 2. Treatment: rest, drink plenty of fluids, stop drinking mountain dew and monster drinks, advance diet slowly 3. Follow Up: Please followup with your primary doctor in 2 days for discussion of your diagnoses and further evaluation after today's visit; if you do not have a primary care doctor use the resource guide provided to find one; Please return to the ER for persistent vomiting, high fevers or worsening symptoms    Emergency Department Resource Guide 1) Find a Doctor and Pay Out of Pocket Although you won't have to find out who is covered by your insurance plan, it is a good idea to ask around and get recommendations. You will then need to call the office and see if the doctor you have chosen will accept you as a new patient and what types of options they offer for patients who are self-pay. Some doctors offer discounts or will set up payment plans for their patients who do not have insurance, but you will need to ask so you aren't surprised when you get to your appointment.  2) Contact Your Local Health Department Not all health departments have doctors that can see patients for sick visits, but many do, so it is worth a call to see if yours does. If you don't know where your local health department is, you can check in your phone book. The CDC also has a tool to help you locate your state's health department, and many state websites also have listings of all of their local health departments.  3) Find a Walk-in Clinic If your illness is not likely to be very severe or complicated, you may want to try a walk in clinic. These are popping up all over the country in pharmacies, drugstores, and shopping centers. They're usually staffed by nurse practitioners or physician assistants that have been trained to treat common illnesses and complaints. They're usually fairly quick and inexpensive. However, if you have serious medical issues or chronic medical  problems, these are probably not your best option.  No Primary Care Doctor: - Call Health Connect at  [phone] - they can help you locate a primary care doctor that  accepts your insurance, provides certain services, etc. - Physician Referral Service- [phone]  Chronic Pain Problems: Organization         Address  Phone   Notes  [facility]  [phone] Patients need to be referred by their primary care doctor.   Medication Assistance: Organization         Address  Phone   Notes  [facility] [address] [place], [place] [address] [phone] --Must be a resident of [facility] [place] -- Must have NO insurance coverage whatsoever (no Medicaid/ Medicare, etc.) -- The pt. MUST have a primary care doctor that directs their care regularly and follows them in the community   MedAssist  [phone]   [employer]  [phone]    Agencies that provide inexpensive medical care: Organization         Address  Phone   Notes  [facility]  [phone]   [facility]    [phone]   [facility] [address] [place], [place] [address] [phone]   [facility] [address], [place] [phone]   [facility]anned Parenthood    [phone]   [facility]    [phone]  [facility] and [place]  [place] [address], [place] Phone:  [phone], Fax:  [phone] Hours of Operation:  9 am - 6 pm, M-F.  Also accepts Medicaid/Medicare and self-pay.  [facility] for [place] [address], [address], [place] Phone: [phone], Fax: [phone]. Hours of Operation:  8:30 am - 5:30 pm, M-F.  Also accepts Medicaid and self-pay.  [facility] [address] [place] Phone: [phone]   [place], [place], [place] [phone], Ext. [phone]  Mondays & Thursdays: 7-9 AM.  First 15 patients are seen on a first come, first serve basis.    [place] Providers:  Organization         Address  Phone   Notes  [facility] [address], [place] [phone] Also accepts self-pay patients.  [facility] [address], [place]  [phone]   [place], [address], [place] [phone]   [facility] [address], [place] [phone]   Lucianne Lei [address], [place]   [phone] Only accepts [place] Access [place] patients after they have their name applied to their card.   Self-Pay (no insurance) in [facility]:  Organization         Address  Phone   Notes  Sickle Cell Patients, [facility] [place] [phone]   [facility]gent Care [place] [phone]   [facility] [place]  [place], [place], [place] [phone]   [facility]/Dr. Osei-Bonsu  [address] [place] or [address], [place] [phone] Phone number for both [place] and [place] locations is the same.  Urgent Medical and [facility] [address], [place] [phone]   [facility] [address], [place] or [address] [phone]   [facility] [address] [place] [phone], phone; [phone], fax Sees patients 1st and 3rd Saturday of every month.  Must not qualify for public or private insurance (i.e. Medicaid, Medicare, [place] Health Choice, Veterans' Benefits)  Household income should be no more than 200% of the poverty level The clinic cannot treat you if you are pregnant or think you are pregnant  Sexually transmitted diseases are not treated at  the clinic.    Dental Care: Organization         Address  Phone  Notes  [facility] of [facility] [place] [phone] Accepts children up to age 25 who are enrolled in [place] or [place]; pregnant women with a Medicaid card; and children who have applied for Medicaid or [place] Health Choice, but were declined, whose parents can pay a reduced fee at time of service.  [facility] Department of [facility]  [address], [place] [phone] Accepts children up to age 67 who are enrolled in [place] or [place]; pregnant women with a Medicaid card; and children who have applied for Medicaid or  Health Choice, but were declined, whose parents can pay a reduced fee at time of service.  [place]  [phone]  [place] [phone] Patients are seen by appointment only. Walk-ins are not accepted. [place] will see patients 46 years of age and older. Monday - Tuesday (8am-5pm) Most Wednesdays (8:30-5pm) $30 per visit, cash only  [facility] Adult Dental Access PROGRAM  [address], [facility] [phone] Patients are seen by appointment only. Walk-ins are not accepted. [place] will see patients 10 years of age and older. One Wednesday Evening (Monthly: Volunteer Based).  $30 per visit, cash only  [place]  [phone] for adults; Children under age 30, call Graduate Pediatric Dentistry at [phone]. Children aged 7-14, please call [phone] to request a pediatric application.  Dental services are provided in all areas of dental care including fillings, crowns and bridges, complete and partial dentures, implants, gum treatment, root canals, and extractions. Preventive care is also provided. Treatment is provided to both adults and children. Patients are selected via a lottery and there is often a  waiting list.   [facility] [address], [place]  [phone] [email or web address]   [facility] [address] [place], [place] [phone], Ext. [phone] Second and Fourth Thursday of each month, opens at 6:30 AM; Clinic ends at 9 AM.  Patients are seen on a first-come first-served basis, and a limited number are seen during each clinic.   [facility]  [address] Hillard Danker [place], [place] [phone]   Eligibility Requirements You must have lived in [place], [place], or [place] counties for at least the last three months.   You cannot be eligible for state or federal sponsored [employer], including [employer], [place], or [employer].   You generally cannot be eligible for healthcare insurance through your employer.    How to apply: Eligibility screenings are held every Tuesday and Wednesday afternoon from 1:00 pm until 4:00 pm. You do not need an appointment for the interview!  [facility] [address] [place], [place]   [place]  [place] Department  [place]  [phone]    Behavioral Health Resources in the Community: Intensive Outpatient Programs Organization         Address  Phone  Notes  [place] [place], [place] [phone]   [facility]patient [address] [place], [place]   ADS: Alcohol & Drug S[facility]cs [address] [place], [place]   [place] [address]  [place], [place] or [phone]   Substance Abuse Resources Organization         Address  Phone  Notes  Alcohol and Drug Services  [phone]   [place]  [phone]   The [place]   Chinita Pester  [phone]   Residential & Outpatient Substance Abuse  Program  [phone]   Psychological Services Organization         Address  Phone  Notes  [facility] [place]  [place]  [phone]   [place] [address], [place] or [phone]    Mobile Crisis Teams Organization         Address  Phone  Notes  [facility], Mobile Crisis Care Unit  [phone]   [facility]ive Psychotherapeutic Services  [address] [place], [place]  [facility] [address], Ste [place] [phone]    Self-Help/Support Groups Organization         Address  Phone             Notes  [facility] of [place] - variety of support groups  [place] Call for more information  Narcotics Anonymous (NA), [facility] [address], [employer] [place]  2 meetings at this location   Special educational needs teacher         Address  Phone  Notes  [facility]AP Residential Treatment [place],    [place]  [phone]   [facility]  [address] [place] [address]48016, [place], [place]   [place], [place] [phone] Admissions: 8am-3pm M-F  Incentives Substance [place] [address],    [place], [place] [phone]   [facility] [address] [place], [place], [place]   [facility] [address]  [place], [place]   [facility] Programs - Intensive Outpatient [address] Kristeen [address], [place], [place]   [facility] ([place].,  [place], [place] [phone] or [phone]   [facility] Services (RTS) [address], [place], [place] Accepts Medicaid  [facility] [place] [address]  [place] [phone] Substance Abuse/Addiction Treatment   [facility] Organization          Address  Phone  Notes  [facility]  [phone]   Domenic Schwab, PhD [address] Arlis [place], [place]   [phone] or [phone]   [place], [place] [phone]   [facility] [address] [place], [place] [phone] Insurance/Medicaid/sponsorship through [facility] and Families [address] [place]                                    [place], [place] [phone] [place] [address], [place] [phone]    Dr. Adele Schilder  [phone]   [facility] Dept. 1) [address] [place] 2) [place] 3)  [place] [address], [place] [phone]  [phone]   [place] [phone] or [phone] (After Hours)

## 2015-03-02 LAB — RPR: RPR Ser Ql: NONREACTIVE

## 2015-03-02 LAB — HIV ANTIBODY (ROUTINE TESTING W REFLEX): HIV Screen 4th Generation wRfx: NONREACTIVE

## 2015-03-03 LAB — GC/CHLAMYDIA PROBE AMP (~~LOC~~) NOT AT ARMC
Chlamydia: NEGATIVE
Neisseria Gonorrhea: NEGATIVE

## 2015-04-13 NOTE — L&D Delivery Note (Signed)
Delivery Note At  a healthy female infant  was delivered via spontaneous vaginal delivery  (Presentation:  OA).  APGAR: 9,9 ; weight pending  .   Placenta status: delivered spontaneously .  Cord:  with the following complications:none .   Anesthesia:  epidural Episiotomy: none  Lacerations:  none Suture Repair: n/a Est. Blood Loss (mL):  150ml  Mom to postpartum.  Baby to Couplet care / Skin to Skin.  d/w parents circumcision and they plan to do in the office  Ayse Mccartin W 01/20/2016, 2:13 PM

## 2015-04-17 ENCOUNTER — Emergency Department (HOSPITAL_COMMUNITY)
Admission: EM | Admit: 2015-04-17 | Discharge: 2015-04-17 | Discharge status: Against Medical Advice | Payer: Medicaid Other

## 2015-04-20 ENCOUNTER — Inpatient Hospital Stay (HOSPITAL_COMMUNITY)
Admission: AD | Admit: 2015-04-20 | Discharge: 2015-04-20 | Disposition: A | Discharge status: Home / Self Care | Payer: Self-pay | Source: Ambulatory Visit | Attending: Obstetrics and Gynecology | Admitting: Obstetrics and Gynecology

## 2015-04-20 ENCOUNTER — Encounter (HOSPITAL_COMMUNITY): Payer: Self-pay | Admitting: *Deleted

## 2015-04-20 DIAGNOSIS — R102 Pelvic and perineal pain: Secondary | ICD-10-CM | POA: Insufficient documentation

## 2015-04-20 DIAGNOSIS — F17210 Nicotine dependence, cigarettes, uncomplicated: Secondary | ICD-10-CM | POA: Insufficient documentation

## 2015-04-20 DIAGNOSIS — N926 Irregular menstruation, unspecified: Secondary | ICD-10-CM | POA: Insufficient documentation

## 2015-04-20 LAB — WET PREP, GENITAL
SPERM: NONE SEEN
Trich, Wet Prep: NONE SEEN
Yeast Wet Prep HPF POC: NONE SEEN

## 2015-04-20 LAB — CBC
HEMATOCRIT: 38.8 % (ref 36.0–46.0)
HEMOGLOBIN: 13.4 g/dL (ref 12.0–15.0)
MCH: 31.6 pg (ref 26.0–34.0)
MCHC: 34.5 g/dL (ref 30.0–36.0)
MCV: 91.5 fL (ref 78.0–100.0)
Platelets: 269 10*3/uL (ref 150–400)
RBC: 4.24 MIL/uL (ref 3.87–5.11)
RDW: 12.7 % (ref 11.5–15.5)
WBC: 6.2 10*3/uL (ref 4.0–10.5)

## 2015-04-20 LAB — URINALYSIS, ROUTINE W REFLEX MICROSCOPIC
Bilirubin Urine: NEGATIVE
GLUCOSE, UA: NEGATIVE mg/dL
Ketones, ur: NEGATIVE mg/dL
Leukocytes, UA: NEGATIVE
Nitrite: NEGATIVE
PH: 6 (ref 5.0–8.0)
Protein, ur: NEGATIVE mg/dL
SPECIFIC GRAVITY, URINE: 1.02 (ref 1.005–1.030)

## 2015-04-20 LAB — POCT PREGNANCY, URINE: PREG TEST UR: NEGATIVE

## 2015-04-20 LAB — URINE MICROSCOPIC-ADD ON

## 2015-04-20 LAB — HCG, QUANTITATIVE, PREGNANCY: hCG, Beta Chain, Quant, S: <1 m[IU]/mL (ref <5)

## 2015-04-20 MED ORDER — IBUPROFEN 800 MG PO TABS
800.0000 mg | ORAL_TABLET | Freq: Once | ORAL | Status: DC
Start: 2015-04-20 — End: 2015-04-20

## 2015-04-20 NOTE — Discharge Instructions (Signed)

## 2015-04-20 NOTE — MAU Note (Signed)
Pt states she is having sharp lower abdominal pain that started this morning.  Pt states that ball up into a fetal position makes it better.

## 2015-04-20 NOTE — MAU Provider Note (Signed)
History     CSN: [id number]  Arrival date and time: 04/20/15 1950   First Provider Initiated Contact with Patient 04/20/15 2018      Chief Complaint  Patient presents with  . Abdominal Pain   HPI Comments: [name] is a 30 y.o. [id number] who presents otday with lower abdominal pain that started today. She denies any vaginal bleeding or vaginal discharge. She states that her LMP was 03/17/15. She had a +UPT at home.   Abdominal Pain This is a new problem. The current episode started today. The onset quality is gradual. The problem occurs constantly. The problem has been unchanged. The pain is located in the suprapubic region. The pain is at a severity of 6/10. The quality of the pain is sharp. The abdominal pain does not radiate. Pertinent negatives include no constipation, diarrhea, dysuria, fever, frequency, nausea or vomiting. Nothing aggravates the pain. The pain is relieved by certain positions. She has tried nothing for the symptoms.    Past Medical History  Diagnosis Date  . History of chicken pox   . History of chlamydia     Past Surgical History  Procedure Laterality Date  . Breast surgery      2 llumps remosved in 2007  . Cholecystectomy  2010    Family History  Problem Relation Age of Onset  . Diabetes Maternal Grandmother   . Hypertension Maternal Grandmother   . Diabetes Paternal Grandmother   . Hypertension Paternal Grandmother   . Diabetes Father   . Cancer Paternal Aunt     Social History  Substance Use Topics  . Smoking status: Current Every Day Smoker -- 1.00 packs/day    Types: Cigarettes  . Smokeless tobacco: Never Used  . Alcohol Use: No    Allergies: No Known Allergies  Prescriptions prior to admission  Medication Sig Dispense Refill Last Dose  . Cyclobenzaprine HCl (FLEXERIL PO) Take 1 tablet by mouth at bedtime as needed (muscle spasms). Unknown strength   04/17/2015  . ibuprofen (ADVIL,MOTRIN) 200 MG tablet Take 1,200 mg by mouth every  6 (six) hours as needed for moderate pain.   Past Week at Unknown time    Review of Systems  Constitutional: Negative for fever and chills.  Gastrointestinal: Positive for abdominal pain. Negative for nausea, vomiting, diarrhea and constipation.  Genitourinary: Negative for dysuria, urgency and frequency.   Physical Exam   Blood pressure 116/78, pulse 92, temperature 98.8 F (37.1 C), temperature source Oral, resp. rate 18, last menstrual period 03/17/2015.  Physical Exam  Nursing note and vitals reviewed. Constitutional: She is oriented to person, place, and time. She appears well-developed and well-nourished. No distress.  HENT:  Head: Normocephalic.  Cardiovascular: Normal rate.   Respiratory: Effort normal.  GI: Soft. There is no tenderness. There is no rebound.  Genitourinary:   External: no lesion Vagina: small amount of pink discharge Cervix: pink, smooth, no CMT Uterus: NSSC Adnexa: NT   Neurological: She is alert and oriented to person, place, and time.  Skin: Skin is warm and dry.  Psychiatric: She has a normal mood and affect.   Results for orders placed or performed during the hospital encounter of 04/20/15 (from the past 24 hour(s))  Urinalysis, Routine w reflex microscopic (not at [facility])     Status: Abnormal   Collection Time: 04/20/15  8:00 PM  Result Value Ref Range   Color, Urine YELLOW YELLOW   APPearance CLEAR CLEAR   Specific Gravity, Urine 1.020 1.005 - 1.030  pH 6.0 5.0 - 8.0   Glucose, UA NEGATIVE NEGATIVE mg/dL   Hgb urine dipstick SMALL (A) NEGATIVE   Bilirubin Urine NEGATIVE NEGATIVE   Ketones, ur NEGATIVE NEGATIVE mg/dL   Protein, ur NEGATIVE NEGATIVE mg/dL   Nitrite NEGATIVE NEGATIVE   Leukocytes, UA NEGATIVE NEGATIVE  Urine microscopic-add on     Status: Abnormal   Collection Time: 04/20/15  8:00 PM  Result Value Ref Range   Squamous Epithelial / LPF 0-5 (A) NONE SEEN   WBC, UA 0-5 0 - 5 WBC/hpf   RBC / HPF 0-5 0 - 5 RBC/hpf    Bacteria, UA FEW (A) NONE SEEN  Pregnancy, urine POC     Status: None   Collection Time: 04/20/15  8:09 PM  Result Value Ref Range   Preg Test, Ur NEGATIVE NEGATIVE  Wet prep, genital     Status: Abnormal   Collection Time: 04/20/15  8:25 PM  Result Value Ref Range   Yeast Wet Prep HPF POC NONE SEEN NONE SEEN   Trich, Wet Prep NONE SEEN NONE SEEN   Clue Cells Wet Prep HPF POC PRESENT (A) NONE SEEN   WBC, Wet Prep HPF POC MODERATE (A) NONE SEEN   Sperm NONE SEEN   CBC     Status: None   Collection Time: 04/20/15  9:00 PM  Result Value Ref Range   WBC 6.2 4.0 - 10.5 K/uL   RBC 4.24 3.87 - 5.11 MIL/uL   Hemoglobin 13.4 12.0 - 15.0 g/dL   HCT 16.138.8 09.636.0 - 04.546.0 %   MCV 91.5 78.0 - 100.0 fL   MCH 31.6 26.0 - 34.0 pg   MCHC 34.5 30.0 - 36.0 g/dL   RDW 40.912.7 81.111.5 - 91.415.5 %   Platelets 269 150 - 400 K/uL  hCG, quantitative, pregnancy     Status: None   Collection Time: 04/20/15  9:00 PM  Result Value Ref Range   hCG, Beta Chain, Quant, S <1 <5 mIU/mL    MAU Course  Procedures  MDM   Assessment and Plan   1. Pelvic pain in female   2. Irregular menstrual cycle    DC home Comfort measures reviewed  RX: none  Return to MAU as needed FU with OB as planned  Follow-up Information    Schedule an appointment as soon as possible for a [facility]   Contact information:   [address] [place] [address] [phone]         Tawnya CrookHogan, Janeene Sand Donovan 04/20/2015, 8:21 PM

## 2015-04-21 LAB — HIV ANTIBODY (ROUTINE TESTING W REFLEX): HIV SCREEN 4TH GENERATION: NONREACTIVE

## 2015-04-21 LAB — GC/CHLAMYDIA PROBE AMP (~~LOC~~) NOT AT ARMC
Chlamydia: NEGATIVE
Neisseria Gonorrhea: NEGATIVE

## 2015-04-21 LAB — RPR: RPR: NONREACTIVE

## 2015-05-17 ENCOUNTER — Inpatient Hospital Stay (HOSPITAL_COMMUNITY): Payer: Medicaid Other

## 2015-05-17 ENCOUNTER — Encounter (HOSPITAL_COMMUNITY): Payer: Self-pay

## 2015-05-17 ENCOUNTER — Inpatient Hospital Stay (HOSPITAL_COMMUNITY)
Admission: AD | Admit: 2015-05-17 | Discharge: 2015-05-17 | Disposition: A | Discharge status: Home / Self Care | Payer: Medicaid Other | Source: Ambulatory Visit | Attending: Obstetrics & Gynecology | Admitting: Obstetrics & Gynecology

## 2015-05-17 DIAGNOSIS — O26899 Other specified pregnancy related conditions, unspecified trimester: Secondary | ICD-10-CM

## 2015-05-17 DIAGNOSIS — N760 Acute vaginitis: Secondary | ICD-10-CM | POA: Diagnosis not present

## 2015-05-17 DIAGNOSIS — O23591 Infection of other part of genital tract in pregnancy, first trimester: Secondary | ICD-10-CM | POA: Insufficient documentation

## 2015-05-17 DIAGNOSIS — B9689 Other specified bacterial agents as the cause of diseases classified elsewhere: Secondary | ICD-10-CM

## 2015-05-17 DIAGNOSIS — Z3A01 Less than 8 weeks gestation of pregnancy: Secondary | ICD-10-CM | POA: Insufficient documentation

## 2015-05-17 DIAGNOSIS — O99331 Smoking (tobacco) complicating pregnancy, first trimester: Secondary | ICD-10-CM | POA: Insufficient documentation

## 2015-05-17 DIAGNOSIS — R109 Unspecified abdominal pain: Secondary | ICD-10-CM

## 2015-05-17 DIAGNOSIS — A499 Bacterial infection, unspecified: Secondary | ICD-10-CM

## 2015-05-17 DIAGNOSIS — O3680X0 Pregnancy with inconclusive fetal viability, not applicable or unspecified: Secondary | ICD-10-CM

## 2015-05-17 DIAGNOSIS — F17210 Nicotine dependence, cigarettes, uncomplicated: Secondary | ICD-10-CM | POA: Insufficient documentation

## 2015-05-17 DIAGNOSIS — O9989 Other specified diseases and conditions complicating pregnancy, childbirth and the puerperium: Secondary | ICD-10-CM

## 2015-05-17 LAB — CBC
HCT: 36.3 % (ref 36.0–46.0)
HEMOGLOBIN: 12.6 g/dL (ref 12.0–15.0)
MCH: 31.5 pg (ref 26.0–34.0)
MCHC: 34.7 g/dL (ref 30.0–36.0)
MCV: 90.8 fL (ref 78.0–100.0)
Platelets: 258 10*3/uL (ref 150–400)
RBC: 4 MIL/uL (ref 3.87–5.11)
RDW: 12.8 % (ref 11.5–15.5)
WBC: 5.9 10*3/uL (ref 4.0–10.5)

## 2015-05-17 LAB — POCT PREGNANCY, URINE: Preg Test, Ur: POSITIVE — AB

## 2015-05-17 LAB — URINALYSIS, ROUTINE W REFLEX MICROSCOPIC
Bilirubin Urine: NEGATIVE
GLUCOSE, UA: NEGATIVE mg/dL
Hgb urine dipstick: NEGATIVE
Ketones, ur: NEGATIVE mg/dL
LEUKOCYTES UA: NEGATIVE
NITRITE: NEGATIVE
PH: 6.5 (ref 5.0–8.0)
Protein, ur: NEGATIVE mg/dL
Specific Gravity, Urine: 1.015 (ref 1.005–1.030)

## 2015-05-17 LAB — WET PREP, GENITAL
Sperm: NONE SEEN
TRICH WET PREP: NONE SEEN
YEAST WET PREP: NONE SEEN

## 2015-05-17 LAB — HCG, QUANTITATIVE, PREGNANCY: hCG, Beta Chain, Quant, S: 221 m[IU]/mL — ABNORMAL HIGH (ref <5)

## 2015-05-17 MED ORDER — METRONIDAZOLE 500 MG PO TABS: 500.0000 mg | ORAL_TABLET | Freq: Two times a day (BID) | ORAL | Status: DC

## 2015-05-17 NOTE — Discharge Instructions (Signed)

## 2015-05-17 NOTE — MAU Note (Signed)
Pt was at [facility] on Wednesday and had an HCG done that was 27 per patient. The MD there told her she was around [redacted] weeks pregnant. Pt reports pani in her right lower abdomen since Thursday. Pt denies bleeding.

## 2015-05-17 NOTE — MAU Provider Note (Signed)
History     CSN: [id number]  Arrival date and time: 05/17/15 1046   First Provider Initiated Contact with Patient 05/17/15 1159      Chief Complaint  Patient presents with  . Abdominal Pain   HPI   [name] is a 30 y.o. female [phone] at [redacted]w[redacted]d presenting to MAU with abdominal pain. The pain started on Thursday. The pain is located in the RLQ; the pain does not radiate. She currently rates her pain 5/10; the pain is sharp. She has not taken anything for the pain. The pain is very mild right now.   Plans to see [facility] @ 6 weeks.   OB History    Gravida Para Term Preterm AB TAB SAB Ectopic Multiple Living   6 4 4  1  1   4       Past Medical History  Diagnosis Date  . History of chicken pox   . History of chlamydia   . Medical history non-contributory     Past Surgical History  Procedure Laterality Date  . Breast surgery      2 llumps remosved in 2007  . Cholecystectomy  2010    Family History  Problem Relation Age of Onset  . Diabetes Maternal Grandmother   . Hypertension Maternal Grandmother   . Diabetes Paternal Grandmother   . Hypertension Paternal Grandmother   . Diabetes Father   . Cancer Paternal Aunt     Social History  Substance Use Topics  . Smoking status: Current Every Day Smoker -- 1.00 packs/day    Types: Cigarettes  . Smokeless tobacco: Never Used  . Alcohol Use: No    Allergies: No Known Allergies  Prescriptions prior to admission  Medication Sig Dispense Refill Last Dose  . gabapentin (NEURONTIN) 300 MG capsule Take 300 mg by mouth at bedtime.   Past Month at Unknown time  . ibuprofen (ADVIL,MOTRIN) 200 MG tablet Take 800 mg by mouth every 6 (six) hours as needed for moderate pain.    prn at Unknown time  . MELOXICAM PO Take 1 tablet by mouth daily.   Past Month at Unknown time   Results for orders placed or performed during the hospital encounter of 05/17/15 (from the past 48 hour(s))  Urinalysis, Routine w reflex  microscopic (not at [facility])     Status: None   Collection Time: 05/17/15 11:00 AM  Result Value Ref Range   Color, Urine YELLOW YELLOW   APPearance CLEAR CLEAR   Specific Gravity, Urine 1.015 1.005 - 1.030   pH 6.5 5.0 - 8.0   Glucose, UA NEGATIVE NEGATIVE mg/dL   Hgb urine dipstick NEGATIVE NEGATIVE   Bilirubin Urine NEGATIVE NEGATIVE   Ketones, ur NEGATIVE NEGATIVE mg/dL   Protein, ur NEGATIVE NEGATIVE mg/dL   Nitrite NEGATIVE NEGATIVE   Leukocytes, UA NEGATIVE NEGATIVE    Comment: MICROSCOPIC NOT DONE ON URINES WITH NEGATIVE PROTEIN, BLOOD, LEUKOCYTES, NITRITE, OR GLUCOSE <1000 mg/dL.  Pregnancy, urine POC     Status: Abnormal   Collection Time: 05/17/15 11:09 AM  Result Value Ref Range   Preg Test, Ur POSITIVE (A) NEGATIVE    Comment:        THE SENSITIVITY OF THIS METHODOLOGY IS >24 mIU/mL   CBC     Status: None   Collection Time: 05/17/15 11:55 AM  Result Value Ref Range   WBC 5.9 4.0 - 10.5 K/uL   RBC 4.00 3.87 - 5.11 MIL/uL   Hemoglobin 12.6 12.0 - 15.0 g/dL  HCT 36.3 36.0 - 46.0 %   MCV 90.8 78.0 - 100.0 fL   MCH 31.5 26.0 - 34.0 pg   MCHC 34.7 30.0 - 36.0 g/dL   RDW 09.8 11.9 - 14.7 %   Platelets 258 150 - 400 K/uL  hCG, quantitative, pregnancy     Status: Abnormal   Collection Time: 05/17/15 11:55 AM  Result Value Ref Range   hCG, Beta Chain, Quant, S 221 (H) <5 mIU/mL    Comment:          GEST. AGE      CONC.  (mIU/mL)   <=1 WEEK        5 - 50     2 WEEKS       50 - 500     3 WEEKS       100 - 10,000     4 WEEKS     1,000 - 30,000     5 WEEKS     3,500 - 115,000   6-8 WEEKS     12,000 - 270,000    12 WEEKS     15,000 - 220,000        FEMALE AND NON-PREGNANT FEMALE:     LESS THAN 5 mIU/mL   Wet prep, genital     Status: Abnormal   Collection Time: 05/17/15 12:00 PM  Result Value Ref Range   Yeast Wet Prep HPF POC NONE SEEN NONE SEEN   Trich, Wet Prep NONE SEEN NONE SEEN   Clue Cells Wet Prep HPF POC PRESENT (A) NONE SEEN   WBC, Wet Prep HPF POC FEW  (A) NONE SEEN    Comment: FEW BACTERIA SEEN   Sperm NONE SEEN     US Ob Comp Less 14 Wks  05/17/2015  CLINICAL DATA:  Patient with abdominal pain. Positive pregnancy test. EXAM: OBSTETRIC <14 WK [place] AND TRANSVAGINAL OB US TECHNIQUE: Both transabdominal and transvaginal ultrasound examinations were performed for complete evaluation of the gestation as well as the maternal uterus, adnexal regions, and pelvic cul-de-sac. Transvaginal technique was performed to assess early pregnancy. COMPARISON:  Pelvic ultrasound 09/04/2010 FINDINGS: Intrauterine gestational sac: Not present Yolk sac:  Not present Embryo:  Not present Cardiac Activity: Not present Maternal uterus/adnexae: Small amount of linear fluid within the endometrium. Normal right left ovaries. Right ovarian corpus luteum. No free fluid in the pelvis. IMPRESSION: No intrauterine gestation identified. In the setting of positive pregnancy test and no definite intrauterine pregnancy, this reflects a pregnancy of unknown location. Differential considerations include early normal IUP, abnormal IUP, or nonvisualized ectopic pregnancy. Differentiation is achieved with serial beta HCG supplemented by repeat sonography as clinically warranted. Electronically Signed   By: Annia Belt M.D.   On: 05/17/2015 13:31   US Ob Transvaginal  05/17/2015  CLINICAL DATA:  Patient with abdominal pain. Positive pregnancy test. EXAM: OBSTETRIC <14 WK [place] AND TRANSVAGINAL OB US TECHNIQUE: Both transabdominal and transvaginal ultrasound examinations were performed for complete evaluation of the gestation as well as the maternal uterus, adnexal regions, and pelvic cul-de-sac. Transvaginal technique was performed to assess early pregnancy. COMPARISON:  Pelvic ultrasound 09/04/2010 FINDINGS: Intrauterine gestational sac: Not present Yolk sac:  Not present Embryo:  Not present Cardiac Activity: Not present Maternal uterus/adnexae: Small amount of linear fluid within the endometrium.  Normal right left ovaries. Right ovarian corpus luteum. No free fluid in the pelvis. IMPRESSION: No intrauterine gestation identified. In the setting of positive pregnancy test and no definite intrauterine pregnancy, this reflects  a pregnancy of unknown location. Differential considerations include early normal IUP, abnormal IUP, or nonvisualized ectopic pregnancy. Differentiation is achieved with serial beta HCG supplemented by repeat sonography as clinically warranted. Electronically Signed   By: Annia Belt M.D.   On: 05/17/2015 13:31    Review of Systems  Gastrointestinal: Positive for nausea, vomiting and abdominal pain. Negative for diarrhea.  Genitourinary: Negative for dysuria.       Denies vaginal bleeding    Physical Exam   Blood pressure 119/70, pulse 76, temperature 98.1 F (36.7 C), temperature source Oral, resp. rate 18, height  (1.6 m), weight 154 lb (69.854 kg), last menstrual period 04/20/2014, SpO2 99 %.  Physical Exam  Constitutional: She is oriented to person, place, and time. She appears well-developed and well-nourished. No distress.  HENT:  Head: Normocephalic.  Eyes: Pupils are equal, round, and reactive to light.  Respiratory: Effort normal.  GI: Soft. Normal appearance and bowel sounds are normal. There is tenderness in the right lower quadrant, periumbilical area and suprapubic area. There is no rigidity, no rebound and no guarding. No hernia.  Genitourinary:  Wet prep and GC collected without speculum   Musculoskeletal: Normal range of motion.  Neurological: She is alert and oriented to person, place, and time.  Skin: Skin is warm. She is not diaphoretic.  Psychiatric: Her behavior is normal.    MAU Course  Procedures  None  MDM  Wet prep GC  CBC HCG [place]  O positive blood type   Assessment and Plan   A:  1. Pregnancy of unknown anatomic location   2. Abdominal pain in pregnancy, antepartum   3. BV (bacterial vaginosis)      P:  Discharge home in stable condition  RX: Flagyl Return to MAU if symptoms worsen Ectopic precautions Follow up in the [facility] on Monday for serial Hcg Pelvic rest    Duane Lope, NP 05/17/2015 2:36 PM

## 2015-05-18 LAB — HIV ANTIBODY (ROUTINE TESTING W REFLEX): HIV SCREEN 4TH GENERATION: NONREACTIVE

## 2015-05-19 ENCOUNTER — Ambulatory Visit (INDEPENDENT_AMBULATORY_CARE_PROVIDER_SITE_OTHER): Payer: Self-pay | Admitting: Advanced Practice Midwife

## 2015-05-19 ENCOUNTER — Encounter: Payer: Self-pay | Admitting: Advanced Practice Midwife

## 2015-05-19 DIAGNOSIS — O26899 Other specified pregnancy related conditions, unspecified trimester: Secondary | ICD-10-CM | POA: Insufficient documentation

## 2015-05-19 DIAGNOSIS — O3680X0 Pregnancy with inconclusive fetal viability, not applicable or unspecified: Secondary | ICD-10-CM

## 2015-05-19 DIAGNOSIS — O9989 Other specified diseases and conditions complicating pregnancy, childbirth and the puerperium: Secondary | ICD-10-CM

## 2015-05-19 DIAGNOSIS — R109 Unspecified abdominal pain: Secondary | ICD-10-CM

## 2015-05-19 LAB — HCG, QUANTITATIVE, PREGNANCY: HCG, BETA CHAIN, QUANT, S: 552 m[IU]/mL — AB (ref <5)

## 2015-05-19 LAB — GC/CHLAMYDIA PROBE AMP (~~LOC~~) NOT AT ARMC
Chlamydia: NEGATIVE
NEISSERIA GONORRHEA: NEGATIVE

## 2015-05-19 NOTE — Patient Instructions (Signed)

## 2015-05-19 NOTE — Progress Notes (Signed)
Ms. [name]  is a 30 y.o. [id number] at [redacted]w[redacted]d who presents to the [facility] today for follow-up quant hCG after 48 hours. The patient was seen in MAU on 05/17/15 and had quant hCG of 221 and US showed Gestational sac with nothing in it. She endorses mild crampy pain, no vaginal bleeding or fever today.   OB History  Gravida Para Term Preterm AB SAB TAB Ectopic Multiple Living  # Outcome Date GA Lbr Len/2nd Weight Sex Delivery Anes PTL Lv  6 Current           5 Term 04/21/11 [redacted]w[redacted]d 05:35 / 00:50 7 lb 3.2 oz (3.265 kg) M Vag-Spont EPI  Y     Comments: none  4 SAB 2012          3 Term 2010 [redacted]w[redacted]d  7 lb 6 oz (3.345 kg) Genella Mech EPI  Y  2 Term 2008 [redacted]w[redacted]d  7 lb 3 oz (3.26 kg) F Vag-Spont EPI  Y  1 Term 2006 [redacted]w[redacted]d  7 lb 6 oz (3.345 kg) M Vag-Spont EPI  Y      Past Medical History  Diagnosis Date  . History of chicken pox   . History of chlamydia   . Medical history non-contributory      LMP 04/20/2014  CONSTITUTIONAL: Well-developed, well-nourished female in no acute distress MUSCULOSKELETAL: Normal range of motion.  CARDIOVASCULAR: Regular heart rate RESPIRATORY: Normal effort NEUROLOGICAL: Alert and oriented to person, place, and time.  SKIN: Skin is warm and dry. No rash noted. Not diaphoretic. No erythema. No pallor. PSYCH: Normal mood and affect. Normal behavior. Normal judgment and thought content.  Results for orders placed or performed in visit on 05/19/15 (from the past 24 hour(s))  hCG, quantitative, pregnancy     Status: Abnormal   Collection Time: 05/19/15 11:10 AM  Result Value Ref Range   hCG, Beta Chain, Quant, S 552 (H) <5 mIU/mL    Ref. Range 05/17/2015 11:55 05/17/2015 12:00 05/17/2015 13:27 05/19/2015 11:10  HCG, Beta Chain, Quant, S Latest Ref Range: <5 mIU/mL 221 (H)   552 (H)    A: Appropriate rise in quant hCG after 48 hours Decreased pain.  P: Discharge home First trimester/ectopic precautions discussed Patient will  return for follow-up US in 1 week. Order placed and RN to schedule.  Patient will return to [facility] for results following [place].  Patient may return to MAU as needed or if her condition were to change or worsen   Aviva Signs, CNM 05/19/2015 1:04 PM

## 2015-05-27 ENCOUNTER — Ambulatory Visit (HOSPITAL_COMMUNITY): Payer: Self-pay

## 2015-05-29 ENCOUNTER — Ambulatory Visit (HOSPITAL_COMMUNITY)
Admission: RE | Admit: 2015-05-29 | Discharge: 2015-05-29 | Disposition: A | Discharge status: Home / Self Care | Payer: Medicaid Other | Source: Ambulatory Visit | Attending: Advanced Practice Midwife | Admitting: Advanced Practice Midwife

## 2015-05-29 DIAGNOSIS — Z36 Encounter for antenatal screening of mother: Secondary | ICD-10-CM | POA: Insufficient documentation

## 2015-05-29 DIAGNOSIS — O3481 Maternal care for other abnormalities of pelvic organs, first trimester: Secondary | ICD-10-CM | POA: Insufficient documentation

## 2015-05-29 DIAGNOSIS — Z3A01 Less than 8 weeks gestation of pregnancy: Secondary | ICD-10-CM | POA: Insufficient documentation

## 2015-05-29 DIAGNOSIS — O3680X0 Pregnancy with inconclusive fetal viability, not applicable or unspecified: Secondary | ICD-10-CM

## 2015-05-29 DIAGNOSIS — R109 Unspecified abdominal pain: Secondary | ICD-10-CM

## 2015-05-29 DIAGNOSIS — N8311 Corpus luteum cyst of right ovary: Secondary | ICD-10-CM | POA: Insufficient documentation

## 2015-05-29 DIAGNOSIS — O26899 Other specified pregnancy related conditions, unspecified trimester: Secondary | ICD-10-CM

## 2015-06-02 ENCOUNTER — Telehealth: Payer: Self-pay

## 2015-06-02 DIAGNOSIS — IMO0002 Reserved for concepts with insufficient information to code with codable children: Secondary | ICD-10-CM

## 2015-06-02 NOTE — Telephone Encounter (Signed)
Per Wynelle Bourgeois, CNM, pt needs f/u OB US in 2 weeks due to decreased fetal heart tones on prior US.   OB US scheduled for March 2 @ 1030.  Called pt and LM to return call concerning follow up appt.

## 2015-06-03 NOTE — Telephone Encounter (Signed)
Left message for patient to call back concerning lab results. 

## 2015-06-03 NOTE — Telephone Encounter (Signed)
Pt returned call. I informed her of recommendations, she had no further questions or concerns.

## 2015-06-04 ENCOUNTER — Encounter (HOSPITAL_COMMUNITY): Payer: Self-pay | Admitting: Advanced Practice Midwife

## 2015-06-04 ENCOUNTER — Inpatient Hospital Stay (HOSPITAL_COMMUNITY)
Admission: AD | Admit: 2015-06-04 | Discharge: 2015-06-04 | Discharge status: Against Medical Advice | Payer: Medicaid Other | Source: Ambulatory Visit | Attending: Obstetrics and Gynecology | Admitting: Obstetrics and Gynecology

## 2015-06-04 DIAGNOSIS — N939 Abnormal uterine and vaginal bleeding, unspecified: Secondary | ICD-10-CM | POA: Insufficient documentation

## 2015-06-04 DIAGNOSIS — O26891 Other specified pregnancy related conditions, first trimester: Secondary | ICD-10-CM | POA: Insufficient documentation

## 2015-06-04 DIAGNOSIS — Z5321 Procedure and treatment not carried out due to patient leaving prior to being seen by health care provider: Secondary | ICD-10-CM | POA: Diagnosis not present

## 2015-06-04 DIAGNOSIS — Z3A01 Less than 8 weeks gestation of pregnancy: Secondary | ICD-10-CM | POA: Diagnosis not present

## 2015-06-04 LAB — URINE MICROSCOPIC-ADD ON

## 2015-06-04 LAB — URINALYSIS, ROUTINE W REFLEX MICROSCOPIC
BILIRUBIN URINE: NEGATIVE
Glucose, UA: NEGATIVE mg/dL
Ketones, ur: 15 mg/dL — AB
Nitrite: NEGATIVE
PH: 5.5 (ref 5.0–8.0)
Protein, ur: NEGATIVE mg/dL
SPECIFIC GRAVITY, URINE: 1.02 (ref 1.005–1.030)

## 2015-06-04 NOTE — MAU Note (Signed)
Patient was next to be called in to MAU, but states she has to leave because she has an 28 year old son home alone. Signed AMA form. States she plans to call the clinic in the morning and make an appointment.

## 2015-06-04 NOTE — MAU Note (Signed)
Pt presents to MAU with complaints vaginal bleeding that started today, reports lower abdominal cramping for a week

## 2015-06-05 ENCOUNTER — Inpatient Hospital Stay (HOSPITAL_COMMUNITY): Payer: Medicaid Other

## 2015-06-05 ENCOUNTER — Encounter (HOSPITAL_COMMUNITY): Payer: Self-pay | Admitting: *Deleted

## 2015-06-05 ENCOUNTER — Inpatient Hospital Stay (HOSPITAL_COMMUNITY)
Admission: AD | Admit: 2015-06-05 | Discharge: 2015-06-05 | Disposition: A | Discharge status: Home / Self Care | Payer: Medicaid Other | Source: Ambulatory Visit | Attending: Obstetrics & Gynecology | Admitting: Obstetrics & Gynecology

## 2015-06-05 DIAGNOSIS — R1030 Lower abdominal pain, unspecified: Secondary | ICD-10-CM | POA: Diagnosis not present

## 2015-06-05 DIAGNOSIS — O26899 Other specified pregnancy related conditions, unspecified trimester: Secondary | ICD-10-CM

## 2015-06-05 DIAGNOSIS — Z833 Family history of diabetes mellitus: Secondary | ICD-10-CM | POA: Diagnosis not present

## 2015-06-05 DIAGNOSIS — O2341 Unspecified infection of urinary tract in pregnancy, first trimester: Secondary | ICD-10-CM

## 2015-06-05 DIAGNOSIS — O99331 Smoking (tobacco) complicating pregnancy, first trimester: Secondary | ICD-10-CM | POA: Insufficient documentation

## 2015-06-05 DIAGNOSIS — Z3A01 Less than 8 weeks gestation of pregnancy: Secondary | ICD-10-CM | POA: Diagnosis not present

## 2015-06-05 DIAGNOSIS — F17210 Nicotine dependence, cigarettes, uncomplicated: Secondary | ICD-10-CM | POA: Diagnosis not present

## 2015-06-05 DIAGNOSIS — R109 Unspecified abdominal pain: Secondary | ICD-10-CM | POA: Diagnosis present

## 2015-06-05 DIAGNOSIS — O9989 Other specified diseases and conditions complicating pregnancy, childbirth and the puerperium: Secondary | ICD-10-CM | POA: Insufficient documentation

## 2015-06-05 LAB — URINALYSIS, ROUTINE W REFLEX MICROSCOPIC
BILIRUBIN URINE: NEGATIVE
Glucose, UA: NEGATIVE mg/dL
HGB URINE DIPSTICK: NEGATIVE
Ketones, ur: NEGATIVE mg/dL
Nitrite: NEGATIVE
Protein, ur: NEGATIVE mg/dL
pH: 5.5 (ref 5.0–8.0)

## 2015-06-05 LAB — URINE MICROSCOPIC-ADD ON: RBC / HPF: NONE SEEN RBC/hpf (ref 0–5)

## 2015-06-05 MED ORDER — CEPHALEXIN 500 MG PO CAPS: 500.0000 mg | ORAL_CAPSULE | Freq: Four times a day (QID) | ORAL | Status: DC

## 2015-06-05 NOTE — MAU Provider Note (Signed)
History     CSN: [id number]  Arrival date and time: 06/05/15 4098   First Provider Initiated Contact with Patient 06/05/15 [phone]      Chief Complaint  Patient presents with  . Abdominal Pain   HPI   [name] is a 30 y.o. female [phone] presenting with worsening abdominal pain. The pain is located right near her suprapubic bone. She had an US done recently that showed an IUP with a low fetal heart rate. She is scheduled to have a repeat US on March 2nd. The pain worsened last night; the pain kept her up all night.   She noted some scant spotting yesterday; none today.   She currently rates her pain 9/10; the pain comes and goes.   OB History    Gravida Para Term Preterm AB TAB SAB Ectopic Multiple Living   Past Medical History  Diagnosis Date  . History of chicken pox   . History of chlamydia   . Medical history non-contributory     Past Surgical History  Procedure Laterality Date  . Breast surgery      2 llumps remosved in 2007  . Cholecystectomy  2010    Family History  Problem Relation Age of Onset  . Diabetes Maternal Grandmother   . Hypertension Maternal Grandmother   . Diabetes Paternal Grandmother   . Hypertension Paternal Grandmother   . Diabetes Father   . Cancer Paternal Aunt     Social History  Substance Use Topics  . Smoking status: Current Every Day Smoker -- 1.00 packs/day    Types: Cigarettes  . Smokeless tobacco: Never Used  . Alcohol Use: No    Allergies: No Known Allergies  Prescriptions prior to admission  Medication Sig Dispense Refill Last Dose  . gabapentin (NEURONTIN) 300 MG capsule Take 300 mg by mouth at bedtime.   Past Month at Unknown time  . metroNIDAZOLE (FLAGYL) 500 MG tablet Take 1 tablet (500 mg total) by mouth 2 (two) times daily. 14 tablet 0    Results for orders placed or performed during the hospital encounter of 06/05/15 (from the past 48 hour(s))  Urinalysis, Routine w reflex  microscopic (not at [facility])     Status: Abnormal   Collection Time: 06/05/15  9:05 AM  Result Value Ref Range   Color, Urine YELLOW YELLOW   APPearance HAZY (A) CLEAR   Specific Gravity, Urine <1.005 (L) 1.005 - 1.030   pH 5.5 5.0 - 8.0   Glucose, UA NEGATIVE NEGATIVE mg/dL   Hgb urine dipstick NEGATIVE NEGATIVE   Bilirubin Urine NEGATIVE NEGATIVE   Ketones, ur NEGATIVE NEGATIVE mg/dL   Protein, ur NEGATIVE NEGATIVE mg/dL   Nitrite NEGATIVE NEGATIVE   Leukocytes, UA TRACE (A) NEGATIVE  Urine microscopic-add on     Status: Abnormal   Collection Time: 06/05/15  9:05 AM  Result Value Ref Range   Squamous Epithelial / LPF 6-30 (A) NONE SEEN   WBC, UA 0-5 0 - 5 WBC/hpf   RBC / HPF NONE SEEN 0 - 5 RBC/hpf   Bacteria, UA FEW (A) NONE SEEN   US Ob Transvaginal  06/05/2015  CLINICAL DATA:  First trimester pregnancy with inconclusive fetal viability. Embryonic bradycardia on prior exam. Lower abdominal pain. Gestational age by LMP of 6 weeks 3 days. EXAM: TRANSVAGINAL OB ULTRASOUND TECHNIQUE: Transvaginal ultrasound was performed for complete evaluation of the gestation as well as the maternal  uterus, adnexal regions, and pelvic cul-de-sac. COMPARISON:  05/29/2015 FINDINGS: Intrauterine gestational sac: Visualized/normal in shape. Yolk sac:  Visualized Embryo:  Visualized Cardiac Activity: Visualized Heart Rate: 108 bpm CRL:   8  mm   6 w 5 d                  [place] EDC: 01/24/2016 Subchorionic hemorrhage:  None visualized. Maternal uterus/adnexae: Normal appearance of both ovaries. No mass or free fluid identified. IMPRESSION: Single living IUP with assigned gestational age of [redacted] weeks 3 days. Appropriate interval growth. No significant maternal uterine or adnexal abnormality identified. Electronically Signed   By: [name] M.D.   On: 06/05/2015 10:51    Review of Systems  Constitutional: Negative for fever.  Gastrointestinal: Positive for nausea and vomiting.  Genitourinary: Positive for urgency and  frequency. Negative for dysuria.   Physical Exam   Blood pressure 113/67, pulse 78, temperature 98.3 F (36.8 C), temperature source Oral, resp. rate 18, last menstrual period 04/21/2015.  Physical Exam  Constitutional: She is oriented to person, place, and time. She appears well-developed and well-nourished. No distress.  HENT:  Head: Normocephalic.  Respiratory: Effort normal.  GI: Soft. She exhibits no distension and no mass. There is tenderness in the suprapubic area. There is no rigidity, no rebound and no guarding.  Musculoskeletal: Normal range of motion.  Neurological: She is alert and oriented to person, place, and time.  Skin: Skin is warm. She is not diaphoretic.  Psychiatric: Her behavior is normal.    MAU Course  Procedures  None  MDM  Urine culture pending   Assessment and Plan   A:  1. Abdominal pain affecting pregnancy, antepartum   2. Pregnancy related abdominal pain of lower quadrant, antepartum   3. UTI in pregnancy, antepartum, first trimester     P:  Discharge home in stable condition RX: Keflex Start prenatal care ASAP Limit soda intake; increase PO water intake.   Joanne Lope, NP 06/05/2015 9:45 AM

## 2015-06-05 NOTE — Discharge Instructions (Signed)
Pregnancy and Urinary Tract Infection  A urinary tract infection (UTI) is a bacterial infection of the urinary tract. Infection of the urinary tract can include the ureters, kidneys (pyelonephritis), bladder (cystitis), and urethra (urethritis). All pregnant women should be screened for bacteria in the urinary tract. Identifying and treating a UTI will decrease the risk of preterm labor and developing more serious infections in both the mother and baby.  CAUSES  Bacteria germs cause almost all UTIs.   RISK FACTORS  Many factors can increase your chances of getting a UTI during pregnancy. These include:  · Having a short urethra.  · Poor toilet and hygiene habits.  · Sexual intercourse.  · Blockage of urine along the urinary tract.  · Problems with the pelvic muscles or nerves.  · Diabetes.  · Obesity.  · Bladder problems after having several children.  · Previous history of UTI.  SIGNS AND SYMPTOMS   · Pain, burning, or a stinging feeling when urinating.  · Suddenly feeling the need to urinate right away (urgency).  · Loss of bladder control (urinary incontinence).  · Frequent urination, more than is common with pregnancy.  · Lower abdominal or back discomfort.  · Cloudy urine.  · Blood in the urine (hematuria).  · Fever.   When the kidneys are infected, the symptoms may be:  · Back pain.  · Flank pain on the right side more so than the left.  · Fever.  · Chills.  · Nausea.  · Vomiting.  DIAGNOSIS   A urinary tract infection is usually diagnosed through urine tests. Additional tests and procedures are sometimes done. These may include:  · Ultrasound exam of the kidneys, ureters, bladder, and urethra.  · Looking in the bladder with a lighted tube (cystoscopy).  TREATMENT  Typically, UTIs can be treated with antibiotic medicines.   HOME CARE INSTRUCTIONS   · Only take over-the-counter or prescription medicines as directed by your health care provider. If you were prescribed antibiotics, take them as directed. Finish  them even if you start to feel better.  · Drink enough fluids to keep your urine clear or pale yellow.  · Do not have sexual intercourse until the infection is gone and your health care provider says it is okay.  · Make sure you are tested for UTIs throughout your pregnancy. These infections often come back.   Preventing a UTI in the Future  · Practice good toilet habits. Always wipe from front to back. Use the tissue only once.  · Do not hold your urine. Empty your bladder as soon as possible when the urge comes.  · Do not douche or use deodorant sprays.  · Wash with soap and warm water around the genital area and the anus.  · Empty your bladder before and after sexual intercourse.  · Wear underwear with a cotton crotch.  · Avoid caffeine and carbonated drinks. They can irritate the bladder.  · Drink cranberry juice or take cranberry pills. This may decrease the risk of getting a UTI.  · Do not drink alcohol.  · Keep all your appointments and tests as scheduled.   SEEK MEDICAL CARE IF:   · Your symptoms get worse.  · You are still having fevers 2 or more days after treatment begins.  · You have a rash.  · You feel that you are having problems with medicines prescribed.  · You have abnormal vaginal discharge.  SEEK IMMEDIATE MEDICAL CARE IF:   · You have back or flank   pain.  · You have chills.  · You have blood in your urine.  · You have nausea and vomiting.  · You have contractions of your uterus.  · You have a gush of fluid from the vagina.  MAKE SURE YOU:  · Understand these instructions.    · Will watch your condition.    · Will get help right away if you are not doing well or get worse.       This information is not intended to replace advice given to you by your health care provider. Make sure you discuss any questions you have with your health care provider.     Document Released: 07/24/2010 Document Revised: 01/17/2013 Document Reviewed: 10/26/2012  Elsevier Interactive Patient Education ©2016 [employer]

## 2015-06-05 NOTE — MAU Note (Signed)
Scheduled for u/s on 06-12-15 @ 1030; pain is worse and pt has been up since 0200 this Am with the pain; had spotting yesterday but no today;

## 2015-06-05 NOTE — MAU Note (Signed)
Pain in suprapubic area for past wk and half. No longer bleeding.  Had to leave yesterday due to childcare issue.

## 2015-06-06 LAB — CULTURE, OB URINE: Culture: 2000

## 2015-06-12 ENCOUNTER — Ambulatory Visit (HOSPITAL_COMMUNITY): Payer: Self-pay

## 2015-06-22 ENCOUNTER — Inpatient Hospital Stay (HOSPITAL_COMMUNITY)
Admission: AD | Admit: 2015-06-22 | Discharge: 2015-06-22 | Disposition: A | Discharge status: Home / Self Care | Payer: Medicaid Other | Source: Ambulatory Visit | Attending: Obstetrics & Gynecology | Admitting: Obstetrics & Gynecology

## 2015-06-22 DIAGNOSIS — O99331 Smoking (tobacco) complicating pregnancy, first trimester: Secondary | ICD-10-CM | POA: Diagnosis not present

## 2015-06-22 DIAGNOSIS — K047 Periapical abscess without sinus: Secondary | ICD-10-CM | POA: Insufficient documentation

## 2015-06-22 DIAGNOSIS — O26891 Other specified pregnancy related conditions, first trimester: Secondary | ICD-10-CM | POA: Insufficient documentation

## 2015-06-22 DIAGNOSIS — K0889 Other specified disorders of teeth and supporting structures: Secondary | ICD-10-CM | POA: Diagnosis present

## 2015-06-22 DIAGNOSIS — F17210 Nicotine dependence, cigarettes, uncomplicated: Secondary | ICD-10-CM | POA: Diagnosis not present

## 2015-06-22 DIAGNOSIS — Z3A08 8 weeks gestation of pregnancy: Secondary | ICD-10-CM | POA: Insufficient documentation

## 2015-06-22 MED ORDER — AMOXICILLIN-POT CLAVULANATE 875-125 MG PO TABS
1.0000 | ORAL_TABLET | Freq: Two times a day (BID) | ORAL | Status: DC
Start: 2015-06-22 — End: 2015-07-13

## 2015-06-22 NOTE — MAU Provider Note (Signed)
History     CSN: [id number]  Arrival date and time: 06/22/15 2251   First Provider Initiated Contact with Patient 06/22/15 2303      Chief Complaint  Patient presents with  . Dental Pain   HPI Comments: Scherrie GerlachDanielle [name] is a 30 y.o. [id number] at 6440w6d who presents today with tooth pain. She states that she was seen for a cleaning about a week ago. She was told that she has a tooth that needs to be pulled. She states that she was told by the dentist that they would not pull the tooth while she was pregnant. She denies any abdominal pain, or vaginal bleeding.   Dental Pain  This is a new problem. The current episode started in the past 7 days. The problem occurs constantly. The problem has been gradually worsening. The pain is at a severity of 10/10. Associated symptoms include facial pain and thermal sensitivity. Pertinent negatives include no fever. She has tried acetaminophen for the symptoms. The treatment provided no relief.     Past Medical History  Diagnosis Date  . History of chicken pox   . History of chlamydia   . Medical history non-contributory     Past Surgical History  Procedure Laterality Date  . Breast surgery      2 llumps remosved in 2007  . Cholecystectomy  2010    Family History  Problem Relation Age of Onset  . Diabetes Maternal Grandmother   . Hypertension Maternal Grandmother   . Diabetes Paternal Grandmother   . Hypertension Paternal Grandmother   . Diabetes Father   . Cancer Paternal Aunt     Social History  Substance Use Topics  . Smoking status: Current Every Day Smoker -- 1.00 packs/day    Types: Cigarettes  . Smokeless tobacco: Never Used  . Alcohol Use: No    Allergies: No Known Allergies  Prescriptions prior to admission  Medication Sig Dispense Refill Last Dose  . cephALEXin (KEFLEX) 500 MG capsule Take 1 capsule (500 mg total) by mouth 4 (four) times daily. 20 capsule 0   . metroNIDAZOLE (FLAGYL) 500 MG tablet Take 1 tablet (500 mg  total) by mouth 2 (two) times daily. (Patient not taking: Reported on 06/05/2015) 14 tablet 0     Review of Systems  Constitutional: Negative for fever and chills.  Gastrointestinal: Negative for nausea, vomiting, abdominal pain, diarrhea and constipation.  Genitourinary: Negative for dysuria, urgency and frequency.   Physical Exam   Blood pressure 112/72, pulse 89, temperature 98.8 F (37.1 C), resp. rate 16, height 5\' 4"  (1.626 m), weight 68.856 kg (151 lb 12.8 oz), last menstrual period 04/21/2015, SpO2 98 %.  Physical Exam  Nursing note and vitals reviewed. Constitutional: She is oriented to person, place, and time. She appears well-developed. No distress.  HENT:  Head: Normocephalic.  Mouth/Throat: Abnormal dentition. Dental abscesses present.    Cardiovascular: Normal rate.   Respiratory: Effort normal.  GI: Soft. There is no tenderness. There is no rebound.  Neurological: She is alert and oriented to person, place, and time.  Skin: Skin is warm and dry.  Psychiatric: She has a normal mood and affect.    MAU Course  Procedures  MDM   Assessment and Plan   1. Dental abscess    DC home Comfort measures reviewed  1st Trimester precautions  RX: augmentin BID x 10 days  Note given for patient to give to the dentist.  Return to MAU as needed FU with OB as planned  Follow-up Information    Follow up with MEISINGER,TODD D, MD.   Specialty:  Obstetrics and Gynecology   Why:  As scheduled   Contact information:   [address] [place] [address] [phone]         Tawnya Crook 06/22/2015, 11:04 PM

## 2015-06-22 NOTE — MAU Note (Signed)
Pt c/o lower dental pain that started about 1.5 weeks ago. Feels like it is now getting an abcess. States that she cannot eat or drink anything without it hurting. Tried taking Tylenol this morning-did not help. Denies vag bleeding or abdominal pain.

## 2015-06-22 NOTE — Discharge Instructions (Signed)

## 2015-07-13 ENCOUNTER — Inpatient Hospital Stay (HOSPITAL_COMMUNITY)
Admission: AD | Admit: 2015-07-13 | Discharge: 2015-07-13 | Disposition: A | Discharge status: Home / Self Care | Payer: Medicaid Other | Source: Ambulatory Visit | Attending: Obstetrics and Gynecology | Admitting: Obstetrics and Gynecology

## 2015-07-13 ENCOUNTER — Encounter (HOSPITAL_COMMUNITY): Payer: Self-pay | Admitting: Certified Nurse Midwife

## 2015-07-13 DIAGNOSIS — Z3A11 11 weeks gestation of pregnancy: Secondary | ICD-10-CM | POA: Diagnosis not present

## 2015-07-13 DIAGNOSIS — M5430 Sciatica, unspecified side: Secondary | ICD-10-CM | POA: Insufficient documentation

## 2015-07-13 DIAGNOSIS — M549 Dorsalgia, unspecified: Secondary | ICD-10-CM | POA: Diagnosis present

## 2015-07-13 DIAGNOSIS — O9989 Other specified diseases and conditions complicating pregnancy, childbirth and the puerperium: Secondary | ICD-10-CM | POA: Diagnosis not present

## 2015-07-13 DIAGNOSIS — O99331 Smoking (tobacco) complicating pregnancy, first trimester: Secondary | ICD-10-CM | POA: Insufficient documentation

## 2015-07-13 DIAGNOSIS — O26891 Other specified pregnancy related conditions, first trimester: Secondary | ICD-10-CM | POA: Insufficient documentation

## 2015-07-13 DIAGNOSIS — Z3A12 12 weeks gestation of pregnancy: Secondary | ICD-10-CM | POA: Diagnosis not present

## 2015-07-13 DIAGNOSIS — M5432 Sciatica, left side: Secondary | ICD-10-CM

## 2015-07-13 DIAGNOSIS — F17210 Nicotine dependence, cigarettes, uncomplicated: Secondary | ICD-10-CM | POA: Insufficient documentation

## 2015-07-13 LAB — URINALYSIS, ROUTINE W REFLEX MICROSCOPIC
Glucose, UA: NEGATIVE mg/dL
Ketones, ur: 15 mg/dL — AB
Leukocytes, UA: NEGATIVE
NITRITE: POSITIVE — AB
PROTEIN: 30 mg/dL — AB
Specific Gravity, Urine: 1.025 (ref 1.005–1.030)
pH: 5.5 (ref 5.0–8.0)

## 2015-07-13 LAB — URINE MICROSCOPIC-ADD ON

## 2015-07-13 MED ORDER — ACETAMINOPHEN 325 MG PO TABS
650.0000 mg | ORAL_TABLET | Freq: Once | ORAL | Status: AC
  Administered 2015-07-13: 650 mg via ORAL
  Filled 2015-07-13: qty 2

## 2015-07-13 NOTE — Discharge Instructions (Signed)
Take Flexeril 10 mg up to three times a day. Take Tylenol 325 mg 2 tablets by mouth every 4 hours if needed for pain. Drink at least 8 8-oz glasses of water every day. Keep your appointment in the office tomorrow.

## 2015-07-13 NOTE — MAU Provider Note (Signed)
History     CSN: [id number]  Arrival date and time: 07/13/15 1733   First Provider Initiated Contact with Patient 07/13/15 1753      Chief Complaint  Patient presents with  . Back Pain   HPI [name] 30 y.o. 10645w6d Comes to MAU today with back pain that radiates down her left leg to the calf and ankle.  Had sciatica in January 2017 and was given Meloxicam and gabapentin.  Unable to take that in pregnancy.  States that Tylenol and Flexeril are not working.  Has taken 2 doses - once last night and once at 12 noon today.  OB History    Gravida Para Term Preterm AB TAB SAB Ectopic Multiple Living   6 4 4  1  1   4       Past Medical History  Diagnosis Date  . History of chicken pox   . History of chlamydia   . Medical history non-contributory     Past Surgical History  Procedure Laterality Date  . Breast surgery      2 llumps remosved in 2007  . Cholecystectomy  2010    Family History  Problem Relation Age of Onset  . Diabetes Maternal Grandmother   . Hypertension Maternal Grandmother   . Diabetes Paternal Grandmother   . Hypertension Paternal Grandmother   . Diabetes Father   . Cancer Paternal Aunt     Social History  Substance Use Topics  . Smoking status: Current Every Day Smoker -- 1.00 packs/day    Types: Cigarettes  . Smokeless tobacco: Never Used  . Alcohol Use: No    Allergies: No Known Allergies  Prescriptions prior to admission  Medication Sig Dispense Refill Last Dose  . acetaminophen (TYLENOL) 325 MG tablet Take 650 mg by mouth every 6 (six) hours as needed for moderate pain.   Past Week at Unknown time  . cyclobenzaprine (FLEXERIL) 5 MG tablet Take 5 mg by mouth 3 (three) times daily as needed for muscle spasms.   07/13/2015 at Unknown time  . meloxicam (MOBIC) 7.5 MG tablet Take 7.5 mg by mouth 2 (two) times daily.   07/13/2015 at Unknown time  . Prenatal Vit-Fe Fumarate-FA (MULTIVITAMIN-PRENATAL) 27-0.8 MG TABS tablet Take 1 tablet by mouth  daily at 12 noon.   07/13/2015 at Unknown time  . amoxicillin-clavulanate (AUGMENTIN) 875-125 MG tablet Take 1 tablet by mouth every 12 (twelve) hours. (Patient not taking: Reported on 07/13/2015) 20 tablet 0 Not Taking at Unknown time  . metroNIDAZOLE (FLAGYL) 500 MG tablet Take 1 tablet (500 mg total) by mouth 2 (two) times daily. (Patient not taking: Reported on 06/05/2015) 14 tablet 0 Not Taking at Unknown time    Review of Systems  Constitutional: Negative for fever.  Gastrointestinal: Positive for nausea, vomiting and abdominal pain. Negative for diarrhea and constipation.  Genitourinary:       No vaginal discharge. No vaginal bleeding. No dysuria.  Musculoskeletal: Positive for back pain.       Having difficulty moving due to back pain which is all across the lower back L>R  Neurological:       Back pain radiates down through left hip and left leg.   Physical Exam   Blood pressure 110/75, pulse 80, temperature 97.9 F (36.6 C), temperature source Oral, resp. rate 18, height 5\' 3"  (1.6 m), weight 142 lb 6.4 oz (64.592 kg), last menstrual period 04/21/2015.  Physical Exam  Nursing note and vitals reviewed. Constitutional: She is oriented to  person, place, and time. She appears well-developed and well-nourished.  tearful  HENT:  Head: Normocephalic.  Eyes: EOM are normal.  Neck: Neck supple.  GI: Soft. There is no tenderness.  FHT 164 with doppler  Musculoskeletal:  Having difficulty moving due to back pain which is all across the lower back L>R  Neurological: She is alert and oriented to person, place, and time.  Skin: Skin is warm and dry.  Psychiatric: She has a normal mood and affect.    MAU Course  Procedures Results for orders placed or performed during the hospital encounter of 07/13/15 (from the past 24 hour(s))  Urinalysis, Routine w reflex microscopic (not at [facility])     Status: Abnormal   Collection Time: 07/13/15  5:40 PM  Result Value Ref Range   Color, Urine  AMBER (A) YELLOW   APPearance CLEAR CLEAR   Specific Gravity, Urine 1.025 1.005 - 1.030   pH 5.5 5.0 - 8.0   Glucose, UA NEGATIVE NEGATIVE mg/dL   Hgb urine dipstick SMALL (A) NEGATIVE   Bilirubin Urine SMALL (A) NEGATIVE   Ketones, ur 15 (A) NEGATIVE mg/dL   Protein, ur 30 (A) NEGATIVE mg/dL   Nitrite POSITIVE (A) NEGATIVE   Leukocytes, UA NEGATIVE NEGATIVE  Urine microscopic-add on     Status: Abnormal   Collection Time: 07/13/15  5:40 PM  Result Value Ref Range   Squamous Epithelial / LPF TOO NUMEROUS TO COUNT (A) NONE SEEN   WBC, UA 6-30 0 - 5 WBC/hpf   RBC / HPF 0-5 0 - 5 RBC/hpf   Bacteria, UA MANY (A) NONE SEEN   Urine-Other MUCOUS PRESENT     MDM Called Dr. Jackelyn Knife and discussed plan of care.  Will await urine culture since no complaints of dysuria.  Assessment and Plan  Back pain with sciatica  Plan Reviewed treatment plan. Flexeril 10 mg TID PRN Tylenol by the package directions - one dose given in MAU Alternate heat and ice to the affected area - declined in MAU Drink at least 8 8-oz glasses of water every day. Urine culture pending. Keep your appointment in the office tomorrow.  BURLESON,TERRI 07/13/2015, 6:18 PM

## 2015-07-13 NOTE — MAU Note (Signed)
Pt states she has a pinched nerve in her back. Pt states she is having difficulty walking. Pt has taken OTC medications with no relief.

## 2015-07-14 LAB — OB RESULTS CONSOLE GC/CHLAMYDIA
Chlamydia: NEGATIVE
Gonorrhea: NEGATIVE

## 2015-07-14 LAB — OB RESULTS CONSOLE ANTIBODY SCREEN: ANTIBODY SCREEN: NEGATIVE

## 2015-07-14 LAB — OB RESULTS CONSOLE RPR: RPR: NONREACTIVE

## 2015-07-14 LAB — OB RESULTS CONSOLE HIV ANTIBODY (ROUTINE TESTING): HIV: NONREACTIVE

## 2015-07-14 LAB — OB RESULTS CONSOLE ABO/RH: RH TYPE: POSITIVE

## 2015-07-14 LAB — OB RESULTS CONSOLE RUBELLA ANTIBODY, IGM: Rubella: IMMUNE

## 2015-07-14 LAB — OB RESULTS CONSOLE HEPATITIS B SURFACE ANTIGEN: Hepatitis B Surface Ag: NEGATIVE

## 2015-07-30 ENCOUNTER — Ambulatory Visit: Payer: Medicaid Other | Attending: Family Medicine

## 2015-07-30 DIAGNOSIS — M5442 Lumbago with sciatica, left side: Secondary | ICD-10-CM | POA: Diagnosis not present

## 2015-07-30 NOTE — Patient Instructions (Signed)
   Lifting Principles  Maintain proper posture and head alignment. Slide object as close as possible before lifting. Move obstacles out of the way. Test before lifting; ask for help if too heavy. Tighten stomach muscles without holding breath. Use smooth movements; do not jerk. Use legs to do the work, and pivot with feet. Distribute the work load symmetrically and close to the center of trunk. Push instead of pull whenever possible.   Squat down and hold basket close to stand. Use leg muscles to do the work.    Avoid twisting or bending back. Pivot around using foot movements, and bend at knees if needed when reaching for articles.        Getting Into / Out of Bed   Lower self to lie down on one side by raising legs and lowering head at the same time. Use arms to assist moving without twisting. Bend both knees to roll onto back if desired. To sit up, start from lying on side, and use same move-ments in reverse. Keep trunk aligned with legs.    Shift weight from front foot to back foot as item is lifted off shelf.    When leaning forward to pick object up from floor, extend one leg out behind. Keep back straight. Hold onto a sturdy support with other hand.      Sit upright, head facing forward. Try using a roll to support lower back. Keep shoulders relaxed, and avoid rounded back. Keep hips level with knees. Avoid crossing legs for long periods.     [facility] [address], [address] [place], [place] [address] Phone # [phone] Fax [phone]  

## 2015-07-30 NOTE — Therapy (Signed)
[facility] [address], [address] [place], [place], [address] Phone: [phone]   Fax:  [phone]  Physical Therapy Evaluation  Patient Details  Name: Joanne GerlachDanielle Garza MRN: [id number] Date of Birth: 1985-12-12 Referring Provider: Nadyne Coombesichter, Karen, MD  Encounter Date: 07/30/2015      PT End of Session - 07/30/15 1005    Visit Number 1   PT Start Time 0932   PT Stop Time 1006   PT Time Calculation (min) 34 min   Activity Tolerance Patient limited by pain   Behavior During Therapy [facility] for tasks assessed/performed      Past Medical History  Diagnosis Date  . History of chicken pox   . History of chlamydia   . Medical history non-contributory     Past Surgical History  Procedure Laterality Date  . Breast surgery      2 llumps remosved in 2007  . Cholecystectomy  2010    There were no vitals filed for this visit.       Subjective Assessment - 07/30/15 0937    Subjective Pt is [redacted] weeks pregnant with 5th child and reports Lt LE radiculopathy that began >6 months ago.  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     Pertinent History 4 prior pregnancies   Limitations Sitting;Standing;Walking   How long can you sit comfortably? 20 minutes   How long can you stand comfortably? 20-30 minutes   How long can you walk comfortably? 20-30 minutes   Diagnostic tests none   Patient Stated Goals reduce leg pain   Currently in Pain? Yes   Pain Score 10-Worst pain ever   Pain Location Leg   Pain Orientation Left   Pain Descriptors / Indicators Constant;Stabbing   Pain Type Acute pain   Pain Onset More than a month ago   Pain Frequency Constant   Aggravating Factors  standing, walking, sitting   Pain Relieving Factors nothing.            [facility] Assessment - 07/30/15 0001    Assessment   Medical Diagnosis LBP   Referring Provider Nadyne Coombesichter, Karen, MD   Onset Date/Surgical Date  03/31/15   Next MD Visit none   Prior Therapy none   Precautions   Precautions Other (comment)  Pt is [redacted] weeks pregnant   Restrictions   Weight Bearing Restrictions No   Balance Screen   Has the patient fallen in the past 6 months No   Has the patient had a decrease in activity level because of a fear of falling?  No   Is the patient reluctant to leave their home because of a fear of falling?  Yes   Home Environment   Living Environment Private residence   Prior Function   Level of Independence Independent   Vocation Unemployed   Vocation Requirements 11, 149, 117 and 28 year old children   Cognition   Overall Cognitive Status Within Functional Limits for tasks assessed   Posture/Postural Control   Posture/Postural Control Postural limitations   Postural Limitations Rounded Shoulders;Increased lumbar lordosis;Weight shift right   ROM / Strength   AROM / PROM / Strength AROM;PROM;Strength   AROM   Overall AROM  Deficits;Due to pain   Overall AROM Comments All lumbar AROM limited by pain and increased Lt LE symptoms   AROM Assessment Site Lumbar   Lumbar Flexion limited by 90%   Lumbar Extension limited  by 50%   Lumbar - Right Side Bend limited by 75%   Lumbar - Left Side Bend limited by 50%.   PROM   Overall PROM  Unable to assess;Due to pain   Strength   Overall Strength Deficits   Overall Strength Comments Lt LE: unable to perform long arc quad for testing due to pain, flexion 3+/5, hip flexion 2+/5.  Rt LE 4/5 with pain on the Lt with testing   Transfers   Transfers Sit to Stand;Stand to Sit   Sit to Stand With upper extremity assist;6: Modified independent (Device/Increase time);With armrests   Ambulation/Gait   Ambulation/Gait Yes   Ambulation/Gait Assistance 6: Modified independent (Device/Increase time)   Ambulation Distance (Feet) 100 Feet   Gait Pattern Step-through pattern;Decreased step length - left;Decreased stance time - left;Decreased hip/knee flexion -  right;Lateral trunk lean to right                           PT Education - 07/30/15 0958    Education provided Yes   Education Details body mechanics education.  No HEP issued as pt too painful with all mobiltiy and flexibility.  Pt encouraged to walk as much as possible to maintain strength   Person(s) Educated Patient   Methods Explanation;Handout;Demonstration   Comprehension Verbalized understanding          PT Short Term Goals - 07/30/15 0931    PT SHORT TERM GOAL #1   Title be independent in HEP   Time 1   Period Days   Status Achieved   PT SHORT TERM GOAL #2   Title verbalize understanding of correct body mechanics and modificiations needed to protect spine   Time 1   Period Days   Status Achieved                  Plan - 07/30/15 1005    Clinical Impression Statement Pt presents to PT for 1 time evaluation (limited by [facility] insurance) with Lt LE radiculopathy that began 03/2015 after lifting a heavy box at work.  Pt is [redacted] weeks pregnant.  Pt demonstrates with guarding with all mobility, limited lumbar and hip AROM.  Pt with inability to move Lt LE and demonstrates weakness on Lt>Rt LE with reports of pain with all testing.  Pt sits and stands with Rt>Lt weightbearing.  Pt received body mechanics education.  No exercises issued today as she was unable to perform due to pain.  Pt was encouraged to follow-up with MD to discuss severe Lt LE symptoms.  Pt will be discharged due to limitations due to insurance.   Rehab Potential Good   PT Frequency One time visit   PT Treatment/Interventions Patient/family education;ADLs/Self Care Home Management   PT Next Visit Plan D/C PT.  Pt will follow-up with MD   Consulted and Agree with Plan of Care Patient      Patient will benefit from skilled therapeutic intervention in order to improve the following deficits and impairments:  Pain, Decreased activity tolerance, Difficulty walking, Abnormal gait,  Decreased range of motion, Improper body mechanics  Visit Diagnosis: Bilateral low back pain with left-sided sciatica - Plan: PT plan of care cert/re-cert     Problem List Patient Active Problem List   Diagnosis Date Noted  . Abdominal pain affecting pregnancy, antepartum 05/19/2015    Lorrene Reid, PT 07/30/2015 10:14 AM  [facility] [address], [address] [place],  [place], [address] Phone: [phone]   Fax:  [phone]  Name: [name] MRN: [id number] Date of Birth: 05-Oct-1985

## 2015-08-18 ENCOUNTER — Encounter (HOSPITAL_COMMUNITY): Payer: Self-pay | Admitting: Emergency Medicine

## 2015-08-18 ENCOUNTER — Emergency Department (HOSPITAL_COMMUNITY)
Admission: EM | Admit: 2015-08-18 | Discharge: 2015-08-19 | Disposition: A | Discharge status: Home / Self Care | Payer: Medicaid Other | Attending: Emergency Medicine | Admitting: Emergency Medicine

## 2015-08-18 DIAGNOSIS — Z3A17 17 weeks gestation of pregnancy: Secondary | ICD-10-CM | POA: Insufficient documentation

## 2015-08-18 DIAGNOSIS — O9989 Other specified diseases and conditions complicating pregnancy, childbirth and the puerperium: Secondary | ICD-10-CM | POA: Insufficient documentation

## 2015-08-18 DIAGNOSIS — F17210 Nicotine dependence, cigarettes, uncomplicated: Secondary | ICD-10-CM | POA: Insufficient documentation

## 2015-08-18 DIAGNOSIS — O99332 Smoking (tobacco) complicating pregnancy, second trimester: Secondary | ICD-10-CM | POA: Diagnosis not present

## 2015-08-18 DIAGNOSIS — G8929 Other chronic pain: Secondary | ICD-10-CM | POA: Diagnosis not present

## 2015-08-18 DIAGNOSIS — O99352 Diseases of the nervous system complicating pregnancy, second trimester: Secondary | ICD-10-CM | POA: Diagnosis not present

## 2015-08-18 DIAGNOSIS — M545 Low back pain: Secondary | ICD-10-CM | POA: Insufficient documentation

## 2015-08-18 NOTE — ED Notes (Signed)
Pt stated that she is going to go home and take meds and will come back tomorrow

## 2015-08-18 NOTE — ED Notes (Signed)
Pt. reports chronic left lower back pain radiating to left lower leg onset last year , denies fall or injury , pt. stated she is [redacted] weeks pregnant (G5P4) with no abdominal contractions or vaginal discharge .

## 2015-08-26 ENCOUNTER — Other Ambulatory Visit: Payer: Self-pay | Admitting: Obstetrics and Gynecology

## 2015-08-26 DIAGNOSIS — M5489 Other dorsalgia: Secondary | ICD-10-CM

## 2015-08-27 ENCOUNTER — Other Ambulatory Visit: Payer: Medicaid Other

## 2015-09-01 ENCOUNTER — Ambulatory Visit
Admission: RE | Admit: 2015-09-01 | Discharge: 2015-09-01 | Disposition: A | Discharge status: Home / Self Care | Payer: Medicaid Other | Source: Ambulatory Visit | Attending: Obstetrics and Gynecology | Admitting: Obstetrics and Gynecology

## 2015-09-01 DIAGNOSIS — M5489 Other dorsalgia: Secondary | ICD-10-CM

## 2015-11-30 ENCOUNTER — Encounter (HOSPITAL_COMMUNITY): Payer: Self-pay

## 2015-11-30 ENCOUNTER — Inpatient Hospital Stay (HOSPITAL_COMMUNITY)
Admission: AD | Admit: 2015-11-30 | Discharge: 2015-11-30 | Disposition: A | Discharge status: Home / Self Care | Payer: Medicaid Other | Source: Ambulatory Visit | Attending: Obstetrics and Gynecology | Admitting: Obstetrics and Gynecology

## 2015-11-30 DIAGNOSIS — O99333 Smoking (tobacco) complicating pregnancy, third trimester: Secondary | ICD-10-CM | POA: Diagnosis not present

## 2015-11-30 DIAGNOSIS — N949 Unspecified condition associated with female genital organs and menstrual cycle: Secondary | ICD-10-CM | POA: Diagnosis not present

## 2015-11-30 DIAGNOSIS — Z3A31 31 weeks gestation of pregnancy: Secondary | ICD-10-CM | POA: Insufficient documentation

## 2015-11-30 DIAGNOSIS — O4703 False labor before 37 completed weeks of gestation, third trimester: Secondary | ICD-10-CM | POA: Insufficient documentation

## 2015-11-30 DIAGNOSIS — O26893 Other specified pregnancy related conditions, third trimester: Secondary | ICD-10-CM | POA: Insufficient documentation

## 2015-11-30 DIAGNOSIS — R102 Pelvic and perineal pain: Secondary | ICD-10-CM | POA: Insufficient documentation

## 2015-11-30 LAB — URINALYSIS, ROUTINE W REFLEX MICROSCOPIC
Bilirubin Urine: NEGATIVE
Glucose, UA: NEGATIVE mg/dL
HGB URINE DIPSTICK: NEGATIVE
Ketones, ur: NEGATIVE mg/dL
Nitrite: NEGATIVE
Protein, ur: NEGATIVE mg/dL
SPECIFIC GRAVITY, URINE: 1.015 (ref 1.005–1.030)
pH: 7.5 (ref 5.0–8.0)

## 2015-11-30 LAB — URINE MICROSCOPIC-ADD ON

## 2015-11-30 NOTE — MAU Note (Signed)
Pt reports contractions every 10 mins and pelvic pressure that started around 1800. Denies LOF or vag bleeding. Has some white/yellow discharge. Denies odor or itching. +FM

## 2015-11-30 NOTE — MAU Provider Note (Signed)
History     CSN: [id number]  Arrival date and time: 11/30/15 2213   First Provider Initiated Contact with Patient 11/30/15 2303      Chief Complaint  Patient presents with  . Contractions   Scherrie GerlachDanielle [name] is a 30 y.o. [id number] at 6186w6d who presents today pelvic pressure. She denies any VB or LOF. She confirms fetal movement.    Pelvic Pain  The patient's primary symptoms include pelvic pain. This is a new problem. The current episode started today. The problem occurs intermittently. The problem has been unchanged. Pain severity now: 6/10  The problem affects both sides. She is pregnant. Associated symptoms include abdominal pain. Pertinent negatives include no chills, constipation, diarrhea, dysuria, fever, frequency, nausea, urgency or vomiting. The vaginal discharge was normal. There has been no bleeding. Nothing aggravates the symptoms. She has tried nothing for the symptoms. Sexual activity: had intercourse at 1400 today      Past Medical History:  Diagnosis Date  . History of chicken pox   . History of chlamydia   . Medical history non-contributory     Past Surgical History:  Procedure Laterality Date  . BREAST SURGERY     2 llumps remosved in 2007  . CHOLECYSTECTOMY  2010    Family History  Problem Relation Age of Onset  . Diabetes Maternal Grandmother   . Hypertension Maternal Grandmother   . Diabetes Paternal Grandmother   . Hypertension Paternal Grandmother   . Diabetes Father   . Cancer Paternal Aunt     Social History  Substance Use Topics  . Smoking status: Current Every Day Smoker    Packs/day: 0.00    Types: Cigarettes  . Smokeless tobacco: Never Used  . Alcohol use No    Allergies: No Known Allergies  Prescriptions Prior to Admission  Medication Sig Dispense Refill Last Dose  . acetaminophen (TYLENOL) 325 MG tablet Take 650 mg by mouth every 6 (six) hours as needed for moderate pain.   Taking  . cyclobenzaprine (FLEXERIL) 5 MG tablet Take 5  mg by mouth 3 (three) times daily as needed for muscle spasms.   Taking  . Prenatal Vit-Fe Fumarate-FA (MULTIVITAMIN-PRENATAL) 27-0.8 MG TABS tablet Take 1 tablet by mouth daily at 12 noon.   Taking    Review of Systems  Constitutional: Negative for chills and fever.  Gastrointestinal: Positive for abdominal pain. Negative for constipation, diarrhea, nausea and vomiting.  Genitourinary: Positive for pelvic pain. Negative for dysuria, frequency and urgency.   Physical Exam   Blood pressure 105/63, pulse 75, temperature 98.2 F (36.8 C), temperature source Oral, resp. rate 18, last menstrual period 04/21/2015, SpO2 97 %.  Physical Exam  Nursing note and vitals reviewed. Constitutional: She is oriented to person, place, and time. She appears well-developed and well-nourished. No distress.  HENT:  Head: Normocephalic.  Cardiovascular: Normal rate.   Respiratory: Effort normal.  GI: Soft. There is no tenderness. There is no rebound.  Genitourinary:  Genitourinary Comments: Cervix: 1@ext  os/closed@ ext os/thick/posterior.   Neurological: She is alert and oriented to person, place, and time.  Skin: Skin is warm and dry.  Psychiatric: She has a normal mood and affect.   FHT: 125, moderate with 15x15 accels, no decels Toco; no UCs  MAU Course  Procedures  MDM 2324: D/W Dr. Jackelyn KnifeMeisinger ok for DC home.  Assessment and Plan   1. Pelvic pressure in pregnancy, antepartum, third trimester   2. [redacted] weeks gestation of pregnancy    DC home Comfort  measures reviewed  3rd Trimester precautions  PTL precautions  Fetal kick counts RX: none  Return to MAU as needed FU with OB as planned  Follow-up Information    MEISINGER,TODD D, MD .   Specialty:  Obstetrics and Gynecology Contact information: [address], [address] [place] [address] [phone]            Tawnya CrookHogan, Zabian Swayne Donovan 11/30/2015, 11:07 PM

## 2015-11-30 NOTE — Discharge Instructions (Signed)

## 2015-12-24 LAB — OB RESULTS CONSOLE GBS: STREP GROUP B AG: NEGATIVE

## 2016-01-14 ENCOUNTER — Encounter (HOSPITAL_COMMUNITY): Payer: Self-pay | Admitting: *Deleted

## 2016-01-14 ENCOUNTER — Telehealth (HOSPITAL_COMMUNITY): Payer: Self-pay | Admitting: *Deleted

## 2016-01-14 NOTE — Telephone Encounter (Signed)
Preadmission screen  

## 2016-01-19 NOTE — H&P (Signed)
[name] is a 30 y.o. female [id number] at 5739 1/7 weeks (EDD 01/26/16 by LMP c/Garza 6 week US)  presenting for IOL at term.  Prenatal care complicated by a herniated disk with free floating fragment at S1 managed with regional injections and ok for an epidural.  Also, persistent UTI when was noncompliant with antibiotic prescriptions for most of pregnancy.  Last culture 12/23/15 was negative and patient haas been instructed to take keflex q day for suppression.   She had a false positive RPR, treponemal negative.  There was a fundal lag but US at 35 weeks showed EFW 43%ile.    OB History    Gravida Para Term Preterm AB Living   6 4 4   1 4    SAB TAB Ectopic Multiple Live Births   1       4    NSVD x 4 (all in 7 lb range) SAB x 1  Past Medical History:  Diagnosis Date  . History of chicken pox   . History of chlamydia   . Medical history non-contributory   . Ruptured intervertebral disc    S1   Past Surgical History:  Procedure Laterality Date  . BREAST SURGERY     2 llumps remosved in 2007  . CHOLECYSTECTOMY  2010   Family History: family history includes Cancer in her paternal aunt; Diabetes in her father, maternal grandmother, and paternal grandmother; Hypertension in her maternal grandmother and paternal grandmother. Social History:  reports that she has been smoking Cigarettes.  She has been smoking about 0.50 packs per day. She has never used smokeless tobacco. She reports that she does not drink alcohol or use drugs.     Maternal Diabetes: No Genetic Screening: Declined Maternal Ultrasounds/Referrals: Normal Fetal Ultrasounds or other Referrals:  None Maternal Substance Abuse:  Yes:  Type: Smoker Significant Maternal Medications:  Meds include: Other: Percocet prn back pain Significant Maternal Lab Results:  None Other Comments:  None  Review of Systems  Musculoskeletal: Positive for back pain.   Maternal Medical History:  Contractions: Frequency: irregular.    Perceived severity is mild.    Fetal activity: Perceived fetal activity is normal.    Prenatal Complications - Diabetes: none.      Last menstrual period 04/21/2015. Maternal Exam:  Uterine Assessment: Contraction strength is mild.  Contraction frequency is irregular.   Abdomen: Patient reports no abdominal tenderness. Fetal presentation: vertex  Introitus: Normal vulva. Normal vagina.    Physical Exam  Constitutional: She appears well-developed.  Cardiovascular: Normal rate and regular rhythm.   Respiratory: Effort normal.  GI: Soft.  Genitourinary: Vagina normal.  Musculoskeletal: Normal range of motion.  Neurological: She is alert.  Psychiatric: She has a normal mood and affect.    Prenatal labs: ABO, Rh: O/Positive/-- (04/03 0000) Antibody: Negative (04/03 0000) Rubella: Immune (04/03 0000) RPR: Nonreactive (04/03 0000)  HBsAg: Negative (04/03 0000)  HIV: Non-reactive (04/03 0000)  GBS: Negative (09/13 0000)  Declined genetic screens One hour GCT 109  Assessment/Plan: Pt admitted for IOL at term with pregnancy complicated by ruptured disk and treated with regional injections and percocet prn.  Plan AROM and pitocin  [name] 01/19/2016, 8:46 PM

## 2016-01-20 ENCOUNTER — Inpatient Hospital Stay (HOSPITAL_COMMUNITY)
Admission: RE | Admit: 2016-01-20 | Discharge: 2016-01-21 | DRG: 775 | Disposition: A | Discharge status: Home / Self Care | Payer: Medicaid Other | Source: Ambulatory Visit | Attending: Obstetrics and Gynecology | Admitting: Obstetrics and Gynecology

## 2016-01-20 ENCOUNTER — Encounter (HOSPITAL_COMMUNITY): Payer: Self-pay

## 2016-01-20 ENCOUNTER — Inpatient Hospital Stay (HOSPITAL_COMMUNITY): Payer: Medicaid Other | Admitting: Anesthesiology

## 2016-01-20 DIAGNOSIS — Z8249 Family history of ischemic heart disease and other diseases of the circulatory system: Secondary | ICD-10-CM

## 2016-01-20 DIAGNOSIS — M5126 Other intervertebral disc displacement, lumbar region: Secondary | ICD-10-CM | POA: Diagnosis present

## 2016-01-20 DIAGNOSIS — F17210 Nicotine dependence, cigarettes, uncomplicated: Secondary | ICD-10-CM | POA: Diagnosis present

## 2016-01-20 DIAGNOSIS — O26899 Other specified pregnancy related conditions, unspecified trimester: Secondary | ICD-10-CM

## 2016-01-20 DIAGNOSIS — Z833 Family history of diabetes mellitus: Secondary | ICD-10-CM

## 2016-01-20 DIAGNOSIS — Z3490 Encounter for supervision of normal pregnancy, unspecified, unspecified trimester: Secondary | ICD-10-CM

## 2016-01-20 DIAGNOSIS — Z3A39 39 weeks gestation of pregnancy: Secondary | ICD-10-CM | POA: Diagnosis not present

## 2016-01-20 DIAGNOSIS — R109 Unspecified abdominal pain: Secondary | ICD-10-CM

## 2016-01-20 DIAGNOSIS — Z3483 Encounter for supervision of other normal pregnancy, third trimester: Secondary | ICD-10-CM | POA: Diagnosis present

## 2016-01-20 DIAGNOSIS — O99334 Smoking (tobacco) complicating childbirth: Secondary | ICD-10-CM | POA: Diagnosis present

## 2016-01-20 LAB — RPR: RPR: NONREACTIVE

## 2016-01-20 LAB — TYPE AND SCREEN
ABO/RH(D): O POS
ANTIBODY SCREEN: NEGATIVE

## 2016-01-20 LAB — CBC
HEMATOCRIT: 30.7 % — AB (ref 36.0–46.0)
HEMOGLOBIN: 10.5 g/dL — AB (ref 12.0–15.0)
MCH: 31.3 pg (ref 26.0–34.0)
MCHC: 34.2 g/dL (ref 30.0–36.0)
MCV: 91.6 fL (ref 78.0–100.0)
Platelets: 313 10*3/uL (ref 150–400)
RBC: 3.35 MIL/uL — AB (ref 3.87–5.11)
RDW: 13.5 % (ref 11.5–15.5)
WBC: 10.9 10*3/uL — AB (ref 4.0–10.5)

## 2016-01-20 MED ORDER — OXYTOCIN 40 UNITS IN LACTATED RINGERS INFUSION - SIMPLE MED: 2.5000 [IU]/h | INTRAVENOUS | Status: DC

## 2016-01-20 MED ORDER — OXYTOCIN BOLUS FROM INFUSION
500.0000 mL | Freq: Once | INTRAVENOUS | Status: AC
  Administered 2016-01-20: 500 mL via INTRAVENOUS

## 2016-01-20 MED ORDER — SENNOSIDES-DOCUSATE SODIUM 8.6-50 MG PO TABS
2.0000 | ORAL_TABLET | ORAL | Status: DC
  Administered 2016-01-21: 2 via ORAL
  Filled 2016-01-20: qty 2

## 2016-01-20 MED ORDER — ZOLPIDEM TARTRATE 5 MG PO TABS: 5.0000 mg | ORAL_TABLET | Freq: Every evening | ORAL | Status: DC | PRN

## 2016-01-20 MED ORDER — PHENYLEPHRINE 40 MCG/ML (10ML) SYRINGE FOR IV PUSH (FOR BLOOD PRESSURE SUPPORT)
80.0000 ug | PREFILLED_SYRINGE | INTRAVENOUS | Status: DC | PRN
  Filled 2016-01-20: qty 10

## 2016-01-20 MED ORDER — IBUPROFEN 600 MG PO TABS
600.0000 mg | ORAL_TABLET | Freq: Four times a day (QID) | ORAL | Status: DC
  Administered 2016-01-20 – 2016-01-21 (×5): 600 mg via ORAL
  Filled 2016-01-20 (×5): qty 1

## 2016-01-20 MED ORDER — BENZOCAINE-MENTHOL 20-0.5 % EX AERO
1.0000 "application " | INHALATION_SPRAY | CUTANEOUS | Status: DC | PRN
  Administered 2016-01-20: 1 via TOPICAL
  Filled 2016-01-20 (×2): qty 56

## 2016-01-20 MED ORDER — LIDOCAINE HCL (PF) 1 % IJ SOLN: 30.0000 mL | INTRAMUSCULAR | Status: DC | PRN

## 2016-01-20 MED ORDER — EPHEDRINE 5 MG/ML INJ: 10.0000 mg | INTRAVENOUS | Status: DC | PRN

## 2016-01-20 MED ORDER — ACETAMINOPHEN 325 MG PO TABS: 650.0000 mg | ORAL_TABLET | ORAL | Status: DC | PRN

## 2016-01-20 MED ORDER — LACTATED RINGERS IV SOLN
INTRAVENOUS | Status: DC
  Administered 2016-01-20 (×2): via INTRAVENOUS

## 2016-01-20 MED ORDER — WITCH HAZEL-GLYCERIN EX PADS: 1.0000 "application " | MEDICATED_PAD | CUTANEOUS | Status: DC | PRN

## 2016-01-20 MED ORDER — DIPHENHYDRAMINE HCL 25 MG PO CAPS: 25.0000 mg | ORAL_CAPSULE | Freq: Four times a day (QID) | ORAL | Status: DC | PRN

## 2016-01-20 MED ORDER — TERBUTALINE SULFATE 1 MG/ML IJ SOLN: 0.2500 mg | Freq: Once | INTRAMUSCULAR | Status: DC | PRN

## 2016-01-20 MED ORDER — COCONUT OIL OIL
1.0000 "application " | TOPICAL_OIL | Status: DC | PRN
  Administered 2016-01-20: 1 via TOPICAL
  Filled 2016-01-20 (×2): qty 120

## 2016-01-20 MED ORDER — TETANUS-DIPHTH-ACELL PERTUSSIS 5-2.5-18.5 LF-MCG/0.5 IM SUSP
0.5000 mL | Freq: Once | INTRAMUSCULAR | Status: DC
  Filled 2016-01-20: qty 0.5

## 2016-01-20 MED ORDER — LACTATED RINGERS IV SOLN
500.0000 mL | Freq: Once | INTRAVENOUS | Status: AC
  Administered 2016-01-20: 500 mL via INTRAVENOUS

## 2016-01-20 MED ORDER — ACETAMINOPHEN 325 MG PO TABS
650.0000 mg | ORAL_TABLET | ORAL | Status: DC | PRN
Start: 2016-01-20 — End: 2016-01-20

## 2016-01-20 MED ORDER — DIBUCAINE 1 % RE OINT
1.0000 "application " | TOPICAL_OINTMENT | RECTAL | Status: DC | PRN
  Filled 2016-01-20: qty 28

## 2016-01-20 MED ORDER — ONDANSETRON HCL 4 MG/2ML IJ SOLN: 4.0000 mg | INTRAMUSCULAR | Status: DC | PRN

## 2016-01-20 MED ORDER — ONDANSETRON HCL 4 MG PO TABS: 4.0000 mg | ORAL_TABLET | ORAL | Status: DC | PRN

## 2016-01-20 MED ORDER — SOD CITRATE-CITRIC ACID 500-334 MG/5ML PO SOLN: 30.0000 mL | ORAL | Status: DC | PRN

## 2016-01-20 MED ORDER — OXYTOCIN 40 UNITS IN LACTATED RINGERS INFUSION - SIMPLE MED
1.0000 m[IU]/min | INTRAVENOUS | Status: DC
  Administered 2016-01-20: 2 m[IU]/min via INTRAVENOUS
  Filled 2016-01-20: qty 1000

## 2016-01-20 MED ORDER — OXYCODONE-ACETAMINOPHEN 5-325 MG PO TABS: 2.0000 | ORAL_TABLET | ORAL | Status: DC | PRN

## 2016-01-20 MED ORDER — FENTANYL CITRATE (PF) 100 MCG/2ML IJ SOLN: 50.0000 ug | INTRAMUSCULAR | Status: DC | PRN

## 2016-01-20 MED ORDER — FENTANYL 2.5 MCG/ML BUPIVACAINE 1/10 % EPIDURAL INFUSION (WH - ANES)
14.0000 mL/h | INTRAMUSCULAR | Status: DC | PRN
  Administered 2016-01-20: 14 mL/h via EPIDURAL
  Filled 2016-01-20: qty 125

## 2016-01-20 MED ORDER — LIDOCAINE HCL (PF) 1 % IJ SOLN
INTRAMUSCULAR | Status: DC | PRN
Start: 2016-01-20 — End: 2016-01-20
  Administered 2016-01-20 (×2): 6 mL via EPIDURAL

## 2016-01-20 MED ORDER — OXYCODONE-ACETAMINOPHEN 5-325 MG PO TABS: 1.0000 | ORAL_TABLET | ORAL | Status: DC | PRN

## 2016-01-20 MED ORDER — OXYCODONE HCL 5 MG PO TABS: 10.0000 mg | ORAL_TABLET | ORAL | Status: DC | PRN

## 2016-01-20 MED ORDER — LACTATED RINGERS IV SOLN: 500.0000 mL | INTRAVENOUS | Status: DC | PRN

## 2016-01-20 MED ORDER — OXYCODONE HCL 5 MG PO TABS
5.0000 mg | ORAL_TABLET | ORAL | Status: DC | PRN
  Administered 2016-01-21: 5 mg via ORAL
  Filled 2016-01-20: qty 1

## 2016-01-20 MED ORDER — ONDANSETRON HCL 4 MG/2ML IJ SOLN: 4.0000 mg | Freq: Four times a day (QID) | INTRAMUSCULAR | Status: DC | PRN

## 2016-01-20 MED ORDER — SIMETHICONE 80 MG PO CHEW: 80.0000 mg | CHEWABLE_TABLET | ORAL | Status: DC | PRN

## 2016-01-20 MED ORDER — PRENATAL MULTIVITAMIN CH
1.0000 | ORAL_TABLET | Freq: Every day | ORAL | Status: DC
  Administered 2016-01-20 – 2016-01-21 (×2): 1 via ORAL
  Filled 2016-01-20 (×2): qty 1

## 2016-01-20 MED ORDER — DIPHENHYDRAMINE HCL 50 MG/ML IJ SOLN: 12.5000 mg | INTRAMUSCULAR | Status: DC | PRN

## 2016-01-20 NOTE — Anesthesia Pain Management Evaluation Note (Signed)
  [facility]NA Pain Management Visit Note  Patient: [name], 30 y.o., female  "Hello I am a member of the anesthesia team at [facility]. We have an anesthesia team available at all times to provide care throughout the hospital, including epidural management and anesthesia for C-section. I don't know your plan for the delivery whether it a natural birth, water birth, IV sedation, nitrous supplementation, doula or epidural, but we want to meet your pain goals."   1.Was your pain managed to your expectations on prior hospitalizations?   Yes   2.What is your expectation for pain management during this hospitalization?     Epidural  3.How can we help you reach that goal? epidural  Record the patient's initial score and the patient's pain goal.   Pain: 0  Pain Goal: 2 [facility] wants you to be able to say your pain was always managed very well.  [name] 01/20/2016

## 2016-01-20 NOTE — Progress Notes (Signed)
Patient ID: Joanne GerlachDanielle Garza, female   DOB: 1985/05/07, 30 y.o.   MRN: [id number] Pt on pitocin and feeling mild contractions only  afeb vss FHR category 1 with occasional variable decel  Cervix 60/2-3/-2  AROM clear  Will continue pitocin and follow progress Anesthesia informed of herniated disk for future epidural placement.

## 2016-01-20 NOTE — Plan of Care (Signed)
Problem: Nutritional: Goal: Mother's verbalization of comfort with breastfeeding process will improve Outcome: Progressing Br/ Bo     

## 2016-01-20 NOTE — Plan of Care (Signed)
Problem: Activity: Goal: Ability to tolerate increased activity will improve Outcome: Completed/Met Date Met: 01/20/16 Pt ambulating around her room without issue.  Pt encouraged to ambulate in the hallways.

## 2016-01-20 NOTE — Anesthesia Procedure Notes (Signed)
Epidural Patient location during procedure: OB Start time: 01/20/2016 11:16 AM End time: 01/20/2016 11:20 AM  Staffing Anesthesiologist: Leilani AbleHATCHETT, Sanjeev Main Performed: anesthesiologist   Preanesthetic Checklist Completed: patient identified, surgical consent, pre-op evaluation, timeout performed, IV checked, risks and benefits discussed and monitors and equipment checked  Epidural Patient position: sitting Prep: site prepped and draped and DuraPrep Patient monitoring: continuous pulse ox and blood pressure Approach: midline Location: L3-L4 Injection technique: LOR air  Needle:  Needle type: Tuohy  Needle gauge: 17 G Needle length: 9 cm and 9 Needle insertion depth: 6 cm Catheter type: closed end flexible Catheter size: 19 Gauge Catheter at skin depth: 11 cm Test dose: negative and Other  Assessment Sensory level: T9 Events: blood not aspirated, injection not painful, no injection resistance, negative IV test and no paresthesia  Additional Notes Reason for block:procedure for pain

## 2016-01-20 NOTE — Anesthesia Postprocedure Evaluation (Signed)
Anesthesia Post Note  Patient: Ellyanna [name]  Procedure(s) Performed: * No procedures listed *  Patient location during evaluation: Mother Baby Anesthesia Type: Epidural Level of consciousness: awake and alert Pain management: pain level controlled Vital Signs Assessment: post-procedure vital signs reviewed and stable Respiratory status: spontaneous breathing, nonlabored ventilation and respiratory function stable Cardiovascular status: stable Postop Assessment: no headache, no backache and epidural receding Anesthetic complications: no     Last Vitals:  Vitals:   01/20/16 1615 01/20/16 1715  BP: (!) 107/55 106/69  Pulse: 60 68  Resp: 18 18  Temp: 36.8 C 36.8 C    Last Pain:  Vitals:   01/20/16 1715  TempSrc: Oral  PainSc: 1    Pain Goal: Patients Stated Pain Goal: 3 (01/20/16 1715)               Marrion CoyMERRITT,Rilee Wendling

## 2016-01-20 NOTE — Progress Notes (Signed)
Patient ID: Joanne GerlachDanielle Garza, female   DOB: [birth year], 30 y.o.   MRN: [id number] Late entry note  Pt seen at 12pm Comfortable with epidural  afeb vss FHR category 1  Cervix 4/80/-1  IUPC placed to adjust pitocin

## 2016-01-20 NOTE — Anesthesia Preprocedure Evaluation (Addendum)
Anesthesia Evaluation  Patient identified by MRN, date of birth, ID band Patient awake    Reviewed: Allergy & Precautions, H&P , NPO status , Patient's Chart, lab work & pertinent test results  Airway Mallampati: I  TM Distance: >3 FB Neck ROM: full    Dental no notable dental hx.    Pulmonary neg pulmonary ROS, Current Smoker,    Pulmonary exam normal        Cardiovascular negative cardio ROS Normal cardiovascular exam     Neuro/Psych negative neurological ROS  negative psych ROS   GI/Hepatic negative GI ROS, Neg liver ROS,   Endo/Other  negative endocrine ROS  Renal/GU negative Renal ROS     Musculoskeletal   Abdominal Normal abdominal exam  (+)   Peds  Hematology negative hematology ROS (+)   Anesthesia Other Findings   Reproductive/Obstetrics (+) Pregnancy                             Anesthesia Physical Anesthesia Plan  ASA: II  Anesthesia Plan: Epidural   Post-op Pain Management:    Induction:   Airway Management Planned:   Additional Equipment:   Intra-op Plan:   Post-operative Plan:   Informed Consent: I have reviewed the patients History and Physical, chart, labs and discussed the procedure including the risks, benefits and alternatives for the proposed anesthesia with the patient or authorized representative who has indicated his/her understanding and acceptance.     Plan Discussed with:   Anesthesia Plan Comments:         Anesthesia Quick Evaluation  

## 2016-01-21 LAB — CBC
HCT: 30.9 % — ABNORMAL LOW (ref 36.0–46.0)
Hemoglobin: 10.7 g/dL — ABNORMAL LOW (ref 12.0–15.0)
MCH: 31.5 pg (ref 26.0–34.0)
MCHC: 34.6 g/dL (ref 30.0–36.0)
MCV: 90.9 fL (ref 78.0–100.0)
PLATELETS: 331 10*3/uL (ref 150–400)
RBC: 3.4 MIL/uL — ABNORMAL LOW (ref 3.87–5.11)
RDW: 13.5 % (ref 11.5–15.5)
WBC: 13.8 10*3/uL — ABNORMAL HIGH (ref 4.0–10.5)

## 2016-01-21 MED ORDER — IBUPROFEN 800 MG PO TABS: 800.0000 mg | ORAL_TABLET | Freq: Three times a day (TID) | ORAL | 1 refills | Status: DC | PRN

## 2016-01-21 MED ORDER — MEDROXYPROGESTERONE ACETATE 150 MG/ML IM SUSP
150.0000 mg | INTRAMUSCULAR | Status: AC | PRN
  Administered 2016-01-21: 150 mg via INTRAMUSCULAR
  Filled 2016-01-21: qty 1

## 2016-01-21 NOTE — Lactation Note (Signed)
This note was copied from a baby's chart. Lactation Consultation Note  Baby sleeping.  Mother states she has been breastfeeding and giving formula afterwards. States baby is not latching deep.  Suggest mother call for LC to help with next feeding. L nipple has crack on tip.  R nipple sore.  Recommend applying ebm and coconut oil. Also discussed pumping since mother states she wants to pump and bottle feed. Pumping is uncomfortable at this time.  Tried pumping and flange size seems accurate. Suggest applying coconut oil to flange.  Provided mother with manual pump for gentle pull. Mother plans to call WIC to get DEBP after discharge. Reviewed engorgement care and monitoring voids/stools. Mom encouraged to feed baby 8-12 times/24 hours and with feeding cues.  Offered to help mother latch when baby cues.  Patient Name: [name] ZOXWR'UToday's Date: 01/21/2016 Reason for consult: Follow-up assessment   Maternal Data    Feeding    LATCH Score/Interventions                      Lactation Tools Discussed/Used     Consult Status Consult Status: Complete    Hardie PulleyBerkelhammer, Ramesh Moan Boschen 01/21/2016, 9:42 AM

## 2016-01-21 NOTE — Discharge Instructions (Signed)
Nothing in vagina for 6 weeks.  No sex, tampons, and douching.  Other instructions as in [facility] Discharge Booklet. °

## 2016-01-21 NOTE — Progress Notes (Signed)
Patient ID: [name], female   DOB: 27-Jun-1985, 30 y.o.   MRN: [id number] Pt doing well. Has mild back pain but tolerable. No fever, chills or headaches. Bonding well with baby- bottlefeeding. No complaints VSS ABD- soft, flat EXT - no edema, no Homans  Hg - 10.7  A/P: PPD#1 s/p svd - stable         Routine pp care         Discharge to home tomorrow         Circ in office

## 2016-01-21 NOTE — Discharge Summary (Signed)
OB Discharge Summary     Patient Name: [name] DOB: 1986-02-06 MRN: [id number]  Date of admission: 01/20/2016 Delivering MD: Huel CoteICHARDSON, KATHY   Date of discharge: 01/21/2016  Admitting diagnosis: INDUCTION Intrauterine pregnancy: 7650w1d     Secondary diagnosis:  Active Problems:   Term pregnancy   NSVD (normal spontaneous vaginal delivery)  Additional problems: hx ruptured disk     Discharge diagnosis: Term Pregnancy Delivered                                                                                                Post partum procedures:none  Augmentation: AROM and Pitocin  Complications: None  Hospital course:  Induction of Labor With Vaginal Delivery   30 y.o. yo [id number] at 2850w1d was admitted to the hospital 01/20/2016 for induction of labor.  Indication for induction: Favorable cervix at term.  Patient had an uncomplicated labor course as follows: Membrane Rupture Time/Date: 9:15 AM ,01/20/2016   Intrapartum Procedures: Episiotomy: None [1]                                         Lacerations:     Patient had delivery of a Viable infant.  Information for the patient's newborn:  [name], Boy [name] [[id number]][[id number]]  Delivery Method: Vaginal, Spontaneous Delivery (Filed from Delivery Summary)   01/20/2016  Details of delivery can be found in separate delivery note.  Patient had a routine postpartum course. Patient is discharged home 01/21/16.   Physical exam Vitals:   01/20/16 1715 01/20/16 2100 01/21/16 0500 01/21/16 0945  BP: 106/69 109/68 109/64 109/68  Pulse: 68 69 61 63  Resp: 18 18 18 18   Temp: 98.2 F (36.8 C) 98.5 F (36.9 C) 98.3 F (36.8 C) 98.1 F (36.7 C)  TempSrc: Oral Oral Oral Oral  SpO2:      Weight:      Height:       General: alert, cooperative and no distress Lochia: appropriate Uterine Fundus: firm Incision: N/A DVT Evaluation: No evidence of DVT seen on physical exam. Labs: Lab Results  Component Value Date   WBC 13.8  (H) 01/21/2016   HGB 10.7 (L) 01/21/2016   HCT 30.9 (L) 01/21/2016   MCV 90.9 01/21/2016   PLT 331 01/21/2016   CMP Latest Ref Rng & Units 03/01/2015  Glucose 65 - 99 mg/dL 85  BUN 6 - 20 mg/dL 6  Creatinine 1.300.44 - 8.651.00 mg/dL 7.840.59  Sodium 696135 - 295145 mmol/L 137  Potassium 3.5 - 5.1 mmol/L 3.3(L)  Chloride 101 - 111 mmol/L 104  CO2 22 - 32 mmol/L 24  Calcium 8.9 - 10.3 mg/dL 9.1  Total Protein 6.5 - 8.1 g/dL 7.2  Total Bilirubin 0.3 - 1.2 mg/dL 0.4  Alkaline Phos 38 - 126 U/L 79  AST 15 - 41 U/L 22  ALT 14 - 54 U/L 15    Discharge instruction: per After Visit Summary and "Baby and Me Booklet".  After visit meds:    Medication List  STOP taking these medications   DICLEGIS PO     TAKE these medications   cyclobenzaprine 5 MG tablet Commonly known as:  FLEXERIL Take 5 mg by mouth 3 (three) times daily as needed for muscle spasms.   ibuprofen 800 MG tablet Commonly known as:  ADVIL,MOTRIN Take 1 tablet (800 mg total) by mouth every 8 (eight) hours as needed for headache, moderate pain or cramping.   multivitamin-prenatal 27-0.8 MG Tabs tablet Take 1 tablet by mouth daily at 12 noon.       Diet: routine diet  Activity: Advance as tolerated. Pelvic rest for 6 weeks.   Outpatient follow up:3-4 weeks Follow up Appt:No future appointments. Follow up Visit:No Follow-up on file.  Postpartum contraception: Depo Provera  Newborn Data: Live born female  Birth Weight: 6 lb 3.3 oz (2815 g) APGAR: 9, 9  Baby Feeding: Bottle Disposition:home with mother   01/21/2016 Joanne Areola, DO

## 2016-01-21 NOTE — Lactation Note (Signed)
This note was copied from a baby's chart. Lactation Consultation Note Experienced mom pumped and bottle fed w/her last child who is now 594 yrs old. This is moms 5th baby. She didn't BF any of her children and only gave BM to her 4th child. Mom states she has put the baby to the breast but is painful. Assessed breast. Has everted nipples, tender to touch. No skin break down noted. Discussed obtaining a deep latch and chin tug. Mom had recently given formula to baby. Encouraged STS. Mom has DEBP at bedside. Stated she pumped 10ml. Explained that was great, mom gave to baby prior to formula.  Mom sleepy, wanting to rest. Asked mom if she still had a breast pump at home since she was going to pump and bottle feed. Mom stated no, FOB stated yes.   Educated about newborn behavior, STS, I&O, supply and demand. Discussed supplementing and BF. Encouraged to BF first then supplement if needed. Mom encouraged to feed baby 8-12 times/24 hours and with feeding cues. Reviewed Baby & Me book's Breastfeeding Basics. WH/LC brochure given w/resources, support groups and LC services. Patient Name: [name] Today's Date: 01/21/2016 Reason for consult: Initial assessment   Maternal Data Has patient been taught Hand Expression?: Yes Does the patient have breastfeeding experience prior to this delivery?: Yes  Feeding Feeding Type: Formula Nipple Type: Slow - flow  LATCH Score/Interventions       Type of Nipple: Everted at rest and after stimulation  Comfort (Breast/Nipple): Filling, red/small blisters or bruises, mild/mod discomfort  Problem noted: Mild/Moderate discomfort Interventions (Mild/moderate discomfort): Hand massage;Hand expression  Intervention(s): Breastfeeding basics reviewed;Support Pillows;Position options;Skin to skin     Lactation Tools Discussed/Used Tools: Pump Breast pump type: Double-Electric Breast Pump WIC Program: Yes Pump Review: Setup, frequency, and cleaning;Milk  Storage Initiated by:: RN Date initiated:: 01/20/16   Consult Status Consult Status: Follow-up Date: 01/21/16 Follow-up type: In-patient    Rual Vermeer, Diamond NickelLAURA G 01/21/2016, 2:35 AM

## 2016-01-21 NOTE — Progress Notes (Signed)
Patient ID: [name], female   DOB: 1985-08-27, 30 y.o.   MRN: [id number] Pt requesting early discharge to home. Pain well controlled. Lochia mild. Desires Depo provera before discharge Plan: Discharge to home today on PPD #1           Instructions reviewed           F/u in a month

## 2016-07-24 ENCOUNTER — Emergency Department (HOSPITAL_COMMUNITY)
Admission: EM | Admit: 2016-07-24 | Discharge: 2016-07-24 | Disposition: A | Discharge status: Home / Self Care | Payer: Medicaid Other | Attending: Emergency Medicine | Admitting: Emergency Medicine

## 2016-07-24 ENCOUNTER — Encounter (HOSPITAL_COMMUNITY): Payer: Self-pay

## 2016-07-24 DIAGNOSIS — E876 Hypokalemia: Secondary | ICD-10-CM | POA: Insufficient documentation

## 2016-07-24 DIAGNOSIS — F17210 Nicotine dependence, cigarettes, uncomplicated: Secondary | ICD-10-CM | POA: Insufficient documentation

## 2016-07-24 DIAGNOSIS — N9489 Other specified conditions associated with female genital organs and menstrual cycle: Secondary | ICD-10-CM | POA: Insufficient documentation

## 2016-07-24 DIAGNOSIS — R55 Syncope and collapse: Secondary | ICD-10-CM

## 2016-07-24 LAB — BASIC METABOLIC PANEL
ANION GAP: 6 (ref 5–15)
BUN: 6 mg/dL (ref 6–20)
CALCIUM: 9.1 mg/dL (ref 8.9–10.3)
CO2: 29 mmol/L (ref 22–32)
Chloride: 106 mmol/L (ref 101–111)
Creatinine, Ser: 0.58 mg/dL (ref 0.44–1.00)
GFR calc Af Amer: >60 mL/min (ref >60)
GLUCOSE: 94 mg/dL (ref 65–99)
Potassium: 3.1 mmol/L — ABNORMAL LOW (ref 3.5–5.1)
Sodium: 141 mmol/L (ref 135–145)

## 2016-07-24 LAB — URINALYSIS, ROUTINE W REFLEX MICROSCOPIC
BACTERIA UA: NONE SEEN
BILIRUBIN URINE: NEGATIVE
Glucose, UA: NEGATIVE mg/dL
KETONES UR: NEGATIVE mg/dL
LEUKOCYTES UA: NEGATIVE
NITRITE: NEGATIVE
Protein, ur: NEGATIVE mg/dL
SPECIFIC GRAVITY, URINE: 1.002 — AB (ref 1.005–1.030)
WBC UA: NONE SEEN WBC/hpf (ref 0–5)
pH: 7 (ref 5.0–8.0)

## 2016-07-24 LAB — CBC
HCT: 40.4 % (ref 36.0–46.0)
HEMOGLOBIN: 14.1 g/dL (ref 12.0–15.0)
MCH: 32.2 pg (ref 26.0–34.0)
MCHC: 34.9 g/dL (ref 30.0–36.0)
MCV: 92.2 fL (ref 78.0–100.0)
Platelets: 345 10*3/uL (ref 150–400)
RBC: 4.38 MIL/uL (ref 3.87–5.11)
RDW: 12.8 % (ref 11.5–15.5)
WBC: 6.1 10*3/uL (ref 4.0–10.5)

## 2016-07-24 LAB — I-STAT BETA HCG BLOOD, ED (MC, WL, AP ONLY)

## 2016-07-24 LAB — CBG MONITORING, ED: GLUCOSE-CAPILLARY: 86 mg/dL (ref 65–99)

## 2016-07-24 LAB — HCG, QUANTITATIVE, PREGNANCY: hCG, Beta Chain, Quant, S: <1 m[IU]/mL (ref <5)

## 2016-07-24 MED ORDER — SODIUM CHLORIDE 0.9 % IV BOLUS (SEPSIS)
1000.0000 mL | Freq: Once | INTRAVENOUS | Status: AC
  Administered 2016-07-24: 1000 mL via INTRAVENOUS

## 2016-07-24 MED ORDER — POTASSIUM CHLORIDE CRYS ER 20 MEQ PO TBCR
40.0000 meq | EXTENDED_RELEASE_TABLET | Freq: Once | ORAL | Status: AC
  Administered 2016-07-24: 40 meq via ORAL
  Filled 2016-07-24: qty 2

## 2016-07-24 NOTE — ED Provider Notes (Signed)
[facility]-EMERGENCY DEPT Provider Note   CSN: [id number] Arrival date & time: 07/24/16  1034     History   Chief Complaint Chief Complaint  Patient presents with  . Loss of Consciousness    HPI [name] is a 31 y.o. female.  Patient is a 31 year old female who presents after a syncopal episode. She states that she was in the kitchen fixing her symptoms and drink and then collapsed to the floor. She doesn't remember what happened. She did feel lightheaded prior to the event. There is no palpitations. She hasn't had any recent illnesses. No coughing congestion or cold symptoms. She's been having some intermittent loose stools but denies any ongoing diarrhea. No abdominal pain. She states that 2 weeks ago she took a pregnancy test was positive and she's had 2 menstrual cycle since that time. She's not currently bleeding. She denies any abdominal cramping. No urinary symptoms. No history of syncope in the past.      Past Medical History:  Diagnosis Date  . History of chicken pox   . History of chlamydia   . Ruptured intervertebral disc    S1    Patient Active Problem List   Diagnosis Date Noted  . Term pregnancy 01/20/2016  . NSVD (normal spontaneous vaginal delivery) 01/20/2016  . Abdominal pain affecting pregnancy, antepartum 05/19/2015    Past Surgical History:  Procedure Laterality Date  . BREAST SURGERY     2 llumps remosved in 2007  . CHOLECYSTECTOMY  2010    OB History    Gravida Para Term Preterm AB Living   SAB TAB Ectopic Multiple Live Births   1     0 5       Home Medications    Prior to Admission medications   Medication Sig Start Date End Date Taking? Authorizing Provider  cyclobenzaprine (FLEXERIL) 5 MG tablet Take 5 mg by mouth 3 (three) times daily as needed for muscle spasms.    Historical Provider, MD  ibuprofen (ADVIL,MOTRIN) 800 MG tablet Take 1 tablet (800 mg total) by mouth every 8 (eight) hours as needed for headache,  moderate pain or cramping. 01/21/16   Cecilia Worema Banga, DO  Prenatal Vit-Fe Fumarate-FA (MULTIVITAMIN-PRENATAL) 27-0.8 MG TABS tablet Take 1 tablet by mouth daily at 12 noon.    Historical Provider, MD    Family History Family History  Problem Relation Age of Onset  . Diabetes Maternal Grandmother   . Hypertension Maternal Grandmother   . Diabetes Paternal Grandmother   . Hypertension Paternal Grandmother   . Diabetes Father   . Cancer Paternal Aunt     Social History Social History  Substance Use Topics  . Smoking status: Current Every Day Smoker    Packs/day: 0.50    Types: Cigarettes  . Smokeless tobacco: Never Used  . Alcohol use No     Allergies   Patient has no known allergies.   Review of Systems Review of Systems  Constitutional: Negative for chills, diaphoresis, fatigue and fever.  HENT: Negative for congestion, rhinorrhea and sneezing.   Eyes: Negative.   Respiratory: Negative for cough, chest tightness and shortness of breath.   Cardiovascular: Negative for chest pain and leg swelling.  Gastrointestinal: Positive for diarrhea. Negative for abdominal pain, blood in stool, nausea and vomiting.  Genitourinary: Negative for difficulty urinating, flank pain, frequency and hematuria.  Musculoskeletal: Negative for arthralgias and back pain.  Skin: Negative for rash.  Neurological: Positive for  syncope. Negative for dizziness, speech difficulty, weakness, numbness and headaches.     Physical Exam Updated Vital Signs BP 111/83 (BP Location: Left Arm)   Pulse 71   Temp 97.9 F (36.6 C) (Oral)   Resp 16   Ht  (1.575 m)   Wt 140 lb (63.5 kg)   LMP 07/23/2016 (Exact Date)   SpO2 100%   BMI 25.61 kg/m   Physical Exam  Constitutional: She is oriented to person, place, and time. She appears well-developed and well-nourished.  HENT:  Head: Normocephalic and atraumatic.  Eyes: Pupils are equal, round, and reactive to light.  Neck: Normal range of  motion. Neck supple.  Cardiovascular: Normal rate, regular rhythm and normal heart sounds.   Pulmonary/Chest: Effort normal and breath sounds normal. No respiratory distress. She has no wheezes. She has no rales. She exhibits no tenderness.  Abdominal: Soft. Bowel sounds are normal. There is no tenderness. There is no rebound and no guarding.  Musculoskeletal: Normal range of motion. She exhibits no edema.  Lymphadenopathy:    She has no cervical adenopathy.  Neurological: She is alert and oriented to person, place, and time.  Motor 5/5 all extremities Sensation grossly intact to LT all extremities Finger to Nose intact, no pronator drift CN II-XII grossly intact Gait normal   Skin: Skin is warm and dry. No rash noted.  Psychiatric: She has a normal mood and affect.     ED Treatments / Results  Labs (all labs ordered are listed, but only abnormal results are displayed) Labs Reviewed  BASIC METABOLIC PANEL - Abnormal; Notable for the following:       Result Value   Potassium 3.1 (*)    All other components within normal limits  URINALYSIS, ROUTINE W REFLEX MICROSCOPIC - Abnormal; Notable for the following:    Color, Urine STRAW (*)    Specific Gravity, Urine 1.002 (*)    Hgb urine dipstick MODERATE (*)    Squamous Epithelial / LPF 0-5 (*)    All other components within normal limits  CBC  HCG, QUANTITATIVE, PREGNANCY  CBG MONITORING, ED  CBG MONITORING, ED  I-STAT BETA HCG BLOOD, ED ([facility], [facility], [facility] ONLY)    EKG  EKG Interpretation  Date/Time:  Saturday July 24 2016 10:54:31 EDT Ventricular Rate:  85 PR Interval:    QRS Duration: 92 QT Interval:  438 QTC Calculation: 521 R Axis:   -76 Text Interpretation:  Sinus rhythm Left anterior fascicular block Borderline T abnormalities, anterior leads Prolonged QT interval Baseline wander in lead(s) III aVL since last tracing no significant change Confirmed by Jaedyn Marrufo  MD, Sharyon Peitz ([id number]) on 07/24/2016 11:41:32 AM Also confirmed by  Mairely Foxworth  MD, Negin Hegg ([id number]), editor Misty Stanley ([id number])  on 07/24/2016 11:47:33 AM       Radiology No results found.  Procedures Procedures (including critical care time)  Medications Ordered in ED Medications  sodium chloride 0.9 % bolus 1,000 mL (0 mLs Intravenous Stopped 07/24/16 1236)  potassium chloride SA (K-DUR,KLOR-CON) CR tablet 40 mEq (40 mEq Oral Given 07/24/16 1231)     Initial Impression / Assessment and Plan / ED Course  I have reviewed the triage vital signs and the nursing notes.  Pertinent labs & imaging results that were available during my care of the patient were reviewed by me and considered in my medical decision making (see chart for details).     Patient presents after syncopal episode. She was given IV fluids. She feels better and is  ready to go home. She has no ischemic changes on EKG or arrhythmia. Her labs are non-concerning. Her potassium is slightly low and was given a dose of potassium in the ED. She hasn't had any diarrhea in the ED. She was discharged home in good condition. She was encouraged to follow-up with her PCP. Return precautions were given.  Final Clinical Impressions(s) / ED Diagnoses   Final diagnoses:  Syncope and collapse  Hypokalemia    New Prescriptions New Prescriptions   No medications on file     Rolan Bucco, MD 07/24/16 1246

## 2016-07-24 NOTE — ED Triage Notes (Signed)
She states that as she was performing routine duties in her kitchen, she simply "woke up on the floor with people looking down at me". She further mentions currently being on her period with "heavy cramping".

## 2016-10-14 ENCOUNTER — Encounter: Payer: Self-pay | Admitting: Family Medicine

## 2016-10-14 ENCOUNTER — Ambulatory Visit: Payer: Self-pay

## 2016-10-14 DIAGNOSIS — Z3201 Encounter for pregnancy test, result positive: Secondary | ICD-10-CM

## 2016-10-14 NOTE — Progress Notes (Signed)
Pt here today for pregnancy test.  Resulted positive.  Pt reports LMP 09/16/16.  EDD 06/13/17.  Proof of pregnancy provided to the pt and also contact info to start pregnancy Medicaid given. Pt stated "Thank you", with no further questions.

## 2016-10-19 ENCOUNTER — Encounter: Payer: Self-pay | Admitting: Family Medicine

## 2016-10-21 ENCOUNTER — Encounter (HOSPITAL_COMMUNITY): Payer: Self-pay | Admitting: *Deleted

## 2016-10-21 ENCOUNTER — Inpatient Hospital Stay (HOSPITAL_COMMUNITY): Payer: Medicaid Other

## 2016-10-21 ENCOUNTER — Inpatient Hospital Stay (HOSPITAL_COMMUNITY)
Admission: AD | Admit: 2016-10-21 | Discharge: 2016-10-21 | Discharge status: Against Medical Advice | Payer: Medicaid Other | Source: Ambulatory Visit | Attending: Obstetrics and Gynecology | Admitting: Obstetrics and Gynecology

## 2016-10-21 ENCOUNTER — Inpatient Hospital Stay (HOSPITAL_COMMUNITY)
Admission: AD | Admit: 2016-10-21 | Discharge: 2016-10-21 | Disposition: A | Discharge status: Home / Self Care | Payer: Medicaid Other | Source: Ambulatory Visit | Attending: Obstetrics and Gynecology | Admitting: Obstetrics and Gynecology

## 2016-10-21 DIAGNOSIS — O99331 Smoking (tobacco) complicating pregnancy, first trimester: Secondary | ICD-10-CM | POA: Diagnosis not present

## 2016-10-21 DIAGNOSIS — Z9889 Other specified postprocedural states: Secondary | ICD-10-CM | POA: Diagnosis not present

## 2016-10-21 DIAGNOSIS — O26891 Other specified pregnancy related conditions, first trimester: Secondary | ICD-10-CM | POA: Diagnosis not present

## 2016-10-21 DIAGNOSIS — Z5321 Procedure and treatment not carried out due to patient leaving prior to being seen by health care provider: Secondary | ICD-10-CM | POA: Diagnosis not present

## 2016-10-21 DIAGNOSIS — F17210 Nicotine dependence, cigarettes, uncomplicated: Secondary | ICD-10-CM | POA: Diagnosis not present

## 2016-10-21 DIAGNOSIS — R1030 Lower abdominal pain, unspecified: Secondary | ICD-10-CM | POA: Diagnosis present

## 2016-10-21 DIAGNOSIS — O209 Hemorrhage in early pregnancy, unspecified: Secondary | ICD-10-CM | POA: Insufficient documentation

## 2016-10-21 DIAGNOSIS — Z79899 Other long term (current) drug therapy: Secondary | ICD-10-CM | POA: Diagnosis not present

## 2016-10-21 DIAGNOSIS — R102 Pelvic and perineal pain: Secondary | ICD-10-CM | POA: Insufficient documentation

## 2016-10-21 DIAGNOSIS — R109 Unspecified abdominal pain: Secondary | ICD-10-CM

## 2016-10-21 DIAGNOSIS — Z9049 Acquired absence of other specified parts of digestive tract: Secondary | ICD-10-CM | POA: Insufficient documentation

## 2016-10-21 DIAGNOSIS — Z3A01 Less than 8 weeks gestation of pregnancy: Secondary | ICD-10-CM | POA: Insufficient documentation

## 2016-10-21 DIAGNOSIS — O3680X0 Pregnancy with inconclusive fetal viability, not applicable or unspecified: Secondary | ICD-10-CM

## 2016-10-21 LAB — HCG, QUANTITATIVE, PREGNANCY: hCG, Beta Chain, Quant, S: 3869 m[IU]/mL — ABNORMAL HIGH (ref <5)

## 2016-10-21 LAB — CBC
HEMATOCRIT: 38.7 % (ref 36.0–46.0)
HEMOGLOBIN: 13.3 g/dL (ref 12.0–15.0)
MCH: 31.9 pg (ref 26.0–34.0)
MCHC: 34.4 g/dL (ref 30.0–36.0)
MCV: 92.8 fL (ref 78.0–100.0)
PLATELETS: 360 10*3/uL (ref 150–400)
RBC: 4.17 MIL/uL (ref 3.87–5.11)
RDW: 13.9 % (ref 11.5–15.5)
WBC: 6.1 10*3/uL (ref 4.0–10.5)

## 2016-10-21 LAB — URINALYSIS, ROUTINE W REFLEX MICROSCOPIC
Bilirubin Urine: NEGATIVE
GLUCOSE, UA: NEGATIVE mg/dL
Hgb urine dipstick: NEGATIVE
KETONES UR: NEGATIVE mg/dL
LEUKOCYTES UA: NEGATIVE
Nitrite: NEGATIVE
PH: 6 (ref 5.0–8.0)
Protein, ur: NEGATIVE mg/dL
SPECIFIC GRAVITY, URINE: 1.014 (ref 1.005–1.030)

## 2016-10-21 LAB — POCT PREGNANCY, URINE: Preg Test, Ur: POSITIVE — AB

## 2016-10-21 NOTE — MAU Note (Signed)
Pt reports lower abd pain since last pm, denies dysuria. Spotting last pm, none today.

## 2016-10-21 NOTE — MAU Provider Note (Signed)
Chief Complaint: Abdominal Pain   Seen by provider at 1730      SUBJECTIVE HPI: Joanne GerlachDanielle [name] is a 31 y.o. [id number] at 1963w0d by LMP who presents to maternity admissions reporting lower abdominal pain since last night.  Had some spotting last night, none today.. She denies vaginal itching/burning, urinary symptoms, h/a, dizziness, n/v, or fever/chills.    Abdominal Pain  This is a new problem. The current episode started yesterday. The onset quality is gradual. The problem occurs intermittently. The problem has been unchanged. The pain is located in the suprapubic region. The pain is mild. The quality of the pain is cramping. The abdominal pain does not radiate. Pertinent negatives include no constipation, diarrhea, fever, frequency, headaches, myalgias, nausea or vomiting. Nothing aggravates the pain. The pain is relieved by nothing. She has tried nothing for the symptoms.    RN Note: Pt reports lower abd pain since last pm, denies dysuria. Spotting last pm, none today.   Past Medical History:  Diagnosis Date  . History of chicken pox   . History of chlamydia   . Ruptured intervertebral disc    S1   Past Surgical History:  Procedure Laterality Date  . BREAST SURGERY     2 llumps remosved in 2007  . CHOLECYSTECTOMY  2010   Social History   Social History  . Marital status: Married    Spouse name: N/A  . Number of children: N/A  . Years of education: N/A   Occupational History  . Not on file.   Social History Main Topics  . Smoking status: Current Every Day Smoker    Packs/day: 0.50    Types: Cigarettes  . Smokeless tobacco: Never Used  . Alcohol use No  . Drug use: No  . Sexual activity: Yes   Other Topics Concern  . Not on file   Social History Narrative  . No narrative on file   No current facility-administered medications on file prior to encounter.    Current Outpatient Prescriptions on File Prior to Encounter  Medication Sig Dispense Refill  .  cyclobenzaprine (FLEXERIL) 5 MG tablet Take 5 mg by mouth 3 (three) times daily as needed for muscle spasms.    Marland Kitchen. ibuprofen (ADVIL,MOTRIN) 800 MG tablet Take 1 tablet (800 mg total) by mouth every 8 (eight) hours as needed for headache, moderate pain or cramping. (Patient not taking: Reported on 10/14/2016) 30 tablet 1  . Prenatal Vit-Fe Fumarate-FA (MULTIVITAMIN-PRENATAL) 27-0.8 MG TABS tablet Take 1 tablet by mouth daily at 12 noon.     No Known Allergies  I have reviewed patient's Past Medical Hx, Surgical Hx, Family Hx, Social Hx, medications and allergies.   ROS:  Review of Systems  Constitutional: Negative for fever.  Gastrointestinal: Positive for abdominal pain. Negative for constipation, diarrhea, nausea and vomiting.  Genitourinary: Negative for frequency.  Musculoskeletal: Negative for myalgias.  Neurological: Negative for headaches.   Review of Systems  Other systems negative   Physical Exam  Physical Exam There were no vitals filed for this visit.  Constitutional: Well-developed, well-nourished female in no acute distress.  Cardiovascular: normal rate Respiratory: normal effort GI: Abd soft, non-tender. Pos BS x 4 MS: Extremities nontender, no edema, normal ROM Neurologic: Alert and oriented x 4.  GU: Neg CVAT.  PELVIC EXAM: Refused exam  LAB RESULTS Results for orders placed or performed during the hospital encounter of 10/21/16 (from the past 24 hour(s))  Urinalysis, Routine w reflex microscopic     Status: Abnormal  Collection Time: 10/21/16  1:20 PM  Result Value Ref Range   Color, Urine YELLOW YELLOW   APPearance HAZY (A) CLEAR   Specific Gravity, Urine 1.014 1.005 - 1.030   pH 6.0 5.0 - 8.0   Glucose, UA NEGATIVE NEGATIVE mg/dL   Hgb urine dipstick NEGATIVE NEGATIVE   Bilirubin Urine NEGATIVE NEGATIVE   Ketones, ur NEGATIVE NEGATIVE mg/dL   Protein, ur NEGATIVE NEGATIVE mg/dL   Nitrite NEGATIVE NEGATIVE   Leukocytes, UA NEGATIVE NEGATIVE  Pregnancy,  urine POC     Status: Abnormal   Collection Time: 10/21/16  1:35 PM  Result Value Ref Range   Preg Test, Ur POSITIVE (A) NEGATIVE  CBC     Status: None   Collection Time: 10/21/16  1:54 PM  Result Value Ref Range   WBC 6.1 4.0 - 10.5 K/uL   RBC 4.17 3.87 - 5.11 MIL/uL   Hemoglobin 13.3 12.0 - 15.0 g/dL   HCT 16.1 09.6 - 04.5 %   MCV 92.8 78.0 - 100.0 fL   MCH 31.9 26.0 - 34.0 pg   MCHC 34.4 30.0 - 36.0 g/dL   RDW 40.9 81.1 - 91.4 %   Platelets 360 150 - 400 K/uL  hCG, quantitative, pregnancy     Status: Abnormal   Collection Time: 10/21/16  1:54 PM  Result Value Ref Range   hCG, Beta Chain, Quant, S 3,869 (H) <5 mIU/mL    --/--/O POS (10/10 0755)  IMAGING US Ob Comp Less 14 Wks  Result Date: 10/21/2016 CLINICAL DATA:  Pelvic pain in a pregnant patient. EXAM: OBSTETRIC <14 WK [place] AND TRANSVAGINAL OB US TECHNIQUE: Both transabdominal and transvaginal ultrasound examinations were performed for complete evaluation of the gestation as well as the maternal uterus, adnexal regions, and pelvic cul-de-sac. Transvaginal technique was performed to assess early pregnancy. COMPARISON:  None. FINDINGS: Intrauterine gestational sac: A rounded collection with a mean diameter of 5.2 mm in the endometrium is favored to represent an early gestational sac. Yolk sac: No discrete yolk sac identified. A curvilinear structure in the probable intrauterine gestational sac may represent an early yolk sac but is not definitive. Embryo:  Not Visualized. Cardiac Activity: Heart Rate:   bpm MSD: 5.2  mm   5 w   2  d CRL:    mm    w    d                  [place] EDC: Subchorionic hemorrhage:  None visualized. Maternal uterus/adnexae: There is a corpus luteum cyst on the left. The ovaries are otherwise normal. There is fluid in the endometrial canal superior to the suspected early gestational sac. IMPRESSION: 1. A rounded fluid collection within internal curvilinear structure is favored to represent an early intrauterine  gestational sac, possibly with an early yolk sac. The findings are not definitive on this study. Recommend attention on short-term follow-up. 2. There is a small amount of fluid in the endometrial canal superior to the suspected gestational sac. This is nonspecific. 3. No other acute abnormalities are identified. Electronically Signed   By: Gerome Sam III M.D   On: 10/21/2016 17:16   US Ob Transvaginal  Result Date: 10/21/2016 CLINICAL DATA:  Pelvic pain in a pregnant patient. EXAM: OBSTETRIC <14 WK [place] AND TRANSVAGINAL OB US TECHNIQUE: Both transabdominal and transvaginal ultrasound examinations were performed for complete evaluation of the gestation as well as the maternal uterus, adnexal regions, and pelvic cul-de-sac. Transvaginal technique was performed to assess  early pregnancy. COMPARISON:  None. FINDINGS: Intrauterine gestational sac: A rounded collection with a mean diameter of 5.2 mm in the endometrium is favored to represent an early gestational sac. Yolk sac: No discrete yolk sac identified. A curvilinear structure in the probable intrauterine gestational sac may represent an early yolk sac but is not definitive. Embryo:  Not Visualized. Cardiac Activity: Heart Rate:   bpm MSD: 5.2  mm   5 w   2  d CRL:    mm    w    d                  [place] EDC: Subchorionic hemorrhage:  None visualized. Maternal uterus/adnexae: There is a corpus luteum cyst on the left. The ovaries are otherwise normal. There is fluid in the endometrial canal superior to the suspected early gestational sac. IMPRESSION: 1. A rounded fluid collection within internal curvilinear structure is favored to represent an early intrauterine gestational sac, possibly with an early yolk sac. The findings are not definitive on this study. Recommend attention on short-term follow-up. 2. There is a small amount of fluid in the endometrial canal superior to the suspected gestational sac. This is nonspecific. 3. No other acute abnormalities are  identified. Electronically Signed   By: Gerome Sam III M.D   On: 10/21/2016 17:16    MAU Management/MDM: Ordered usual first trimester r/o ectopic labs.   Pelvic exam and cultures refused by patient Will check baseline Ultrasound to rule out ectopic.  >> US showed single GS with possible yolk sac. Explained that this does not definitively rule out ectopic.  Reviewed recommendation to repeat HCG in 48 hrs and [place] in a week  Consult Dr Mindi Slicker with presentation, exam findings, and results.   Treatments in MAU included none.   This bleeding/pain can represent a normal pregnancy with bleeding, spontaneous abortion or even an ectopic which can be life-threatening.  The process as listed above helps to determine which of these is present.  Patient states she will return in48 hrs for HCG Will schedule [place] for next week Pt states cannot go to Dr Shella Spearing office until her Medicaid comes through  ASSESSMENT Pregnancy at [redacted]w[redacted]d by LMP Pregnancy of unknown location Pelvic pain in early pregnancy   PLAN Discharge home Plan to repeat HCG level in 48 hours in MAU Will repeat  Ultrasound in about 7-10 days if HCG levels double appropriately  Ectopic precautions   Pt stable at time of discharge. Encouraged to return here or to other Urgent Care/ED if she develops worsening of symptoms, increase in pain, fever, or other concerning symptoms.    Wynelle Bourgeois CNM, MSN Certified Nurse-Midwife 10/21/2016  8:00 PM

## 2016-10-21 NOTE — Discharge Instructions (Signed)
First Trimester of Pregnancy The first trimester of pregnancy is from week 1 until the end of week 13 (months 1 through 3). A week after a sperm fertilizes an egg, the egg will implant on the wall of the uterus. This embryo will begin to develop into a baby. Genes from you and your partner will form the baby. The female genes will determine whether the baby will be a boy or a girl. At 6-8 weeks, the eyes and face will be formed, and the heartbeat can be seen on ultrasound. At the end of 12 weeks, all the baby's organs will be formed. Now that you are pregnant, you will want to do everything you can to have a healthy baby. Two of the most important things are to get good prenatal care and to follow your health care provider's instructions. Prenatal care is all the medical care you receive before the baby's birth. This care will help prevent, find, and treat any problems during the pregnancy and childbirth. Body changes during your first trimester Your body goes through many changes during pregnancy. The changes vary from woman to woman.  You may gain or lose a couple of pounds at first.  You may feel sick to your stomach (nauseous) and you may throw up (vomit). If the vomiting is uncontrollable, call your health care provider.  You may tire easily.  You may develop headaches that can be relieved by medicines. All medicines should be approved by your health care provider.  You may urinate more often. Painful urination may mean you have a bladder infection.  You may develop heartburn as a result of your pregnancy.  You may develop constipation because certain hormones are causing the muscles that push stool through your intestines to slow down.  You may develop hemorrhoids or swollen veins (varicose veins).  Your breasts may begin to grow larger and become tender. Your nipples may stick out more, and the tissue that surrounds them (areola) may become darker.  Your gums may bleed and may be  sensitive to brushing and flossing.  Dark spots or blotches (chloasma, mask of pregnancy) may develop on your face. This will likely fade after the baby is born.  Your menstrual periods will stop.  You may have a loss of appetite.  You may develop cravings for certain kinds of food.  You may have changes in your emotions from day to day, such as being excited to be pregnant or being concerned that something may go wrong with the pregnancy and baby.  You may have more vivid and strange dreams.  You may have changes in your hair. These can include thickening of your hair, rapid growth, and changes in texture. Some women also have hair loss during or after pregnancy, or hair that feels dry or thin. Your hair will most likely return to normal after your baby is born.  What to expect at prenatal visits During a routine prenatal visit:  You will be weighed to make sure you and the baby are growing normally.  Your blood pressure will be taken.  Your abdomen will be measured to track your baby's growth.  The fetal heartbeat will be listened to between weeks 10 and 14 of your pregnancy.  Test results from any previous visits will be discussed.  Your health care provider may ask you:  How you are feeling.  If you are feeling the baby move.  If you have had any abnormal symptoms, such as leaking fluid, bleeding, severe headaches,   or abdominal cramping.  If you are using any tobacco products, including cigarettes, chewing tobacco, and electronic cigarettes.  If you have any questions.  Other tests that may be performed during your first trimester include:  Blood tests to find your blood type and to check for the presence of any previous infections. The tests will also be used to check for low iron levels (anemia) and protein on red blood cells (Rh antibodies). Depending on your risk factors, or if you previously had diabetes during pregnancy, you may have tests to check for high blood  sugar that affects pregnant women (gestational diabetes).  Urine tests to check for infections, diabetes, or protein in the urine.  An ultrasound to confirm the proper growth and development of the baby.  Fetal screens for spinal cord problems (spina bifida) and Down syndrome.  HIV (human immunodeficiency virus) testing. Routine prenatal testing includes screening for HIV, unless you choose not to have this test.  You may need other tests to make sure you and the baby are doing well.  Follow these instructions at home: Medicines  Follow your health care provider's instructions regarding medicine use. Specific medicines may be either safe or unsafe to take during pregnancy.  Take a prenatal vitamin that contains at least 600 micrograms (mcg) of folic acid.  If you develop constipation, try taking a stool softener if your health care provider approves. Eating and drinking  Eat a balanced diet that includes fresh fruits and vegetables, whole grains, good sources of protein such as meat, eggs, or tofu, and low-fat dairy. Your health care provider will help you determine the amount of weight gain that is right for you.  Avoid raw meat and uncooked cheese. These carry germs that can cause birth defects in the baby.  Eating four or five small meals rather than three large meals a day may help relieve nausea and vomiting. If you start to feel nauseous, eating a few soda crackers can be helpful. Drinking liquids between meals, instead of during meals, also seems to help ease nausea and vomiting.  Limit foods that are high in fat and processed sugars, such as fried and sweet foods.  To prevent constipation: ? Eat foods that are high in fiber, such as fresh fruits and vegetables, whole grains, and beans. ? Drink enough fluid to keep your urine clear or pale yellow. Activity  Exercise only as directed by your health care provider. Most women can continue their usual exercise routine during  pregnancy. Try to exercise for 30 minutes at least 5 days a week. Exercising will help you: ? Control your weight. ? Stay in shape. ? Be prepared for labor and delivery.  Experiencing pain or cramping in the lower abdomen or lower back is a good sign that you should stop exercising. Check with your health care provider before continuing with normal exercises.  Try to avoid standing for long periods of time. Move your legs often if you must stand in one place for a long time.  Avoid heavy lifting.  Wear low-heeled shoes and practice good posture.  You may continue to have sex unless your health care provider tells you not to. Relieving pain and discomfort  Wear a good support bra to relieve breast tenderness.  Take warm sitz baths to soothe any pain or discomfort caused by hemorrhoids. Use hemorrhoid cream if your health care provider approves.  Rest with your legs elevated if you have leg cramps or low back pain.  If you develop   varicose veins in your legs, wear support hose. Elevate your feet for 15 minutes, 3-4 times a day. Limit salt in your diet. Prenatal care  Schedule your prenatal visits by the twelfth week of pregnancy. They are usually scheduled monthly at first, then more often in the last 2 months before delivery.  Write down your questions. Take them to your prenatal visits.  Keep all your prenatal visits as told by your health care provider. This is important. Safety  Wear your seat belt at all times when driving.  Make a list of emergency phone numbers, including numbers for family, friends, the hospital, and police and fire departments. General instructions  Ask your health care provider for a referral to a local prenatal education class. Begin classes no later than the beginning of month 6 of your pregnancy.  Ask for help if you have counseling or nutritional needs during pregnancy. Your health care provider can offer advice or refer you to specialists for help  with various needs.  Do not use hot tubs, steam rooms, or saunas.  Do not douche or use tampons or scented sanitary pads.  Do not cross your legs for long periods of time.  Avoid cat litter boxes and soil used by cats. These carry germs that can cause birth defects in the baby and possibly loss of the fetus by miscarriage or stillbirth.  Avoid all smoking, herbs, alcohol, and medicines not prescribed by your health care provider. Chemicals in these products affect the formation and growth of the baby.  Do not use any products that contain nicotine or tobacco, such as cigarettes and e-cigarettes. If you need help quitting, ask your health care provider. You may receive counseling support and other resources to help you quit.  Schedule a dentist appointment. At home, brush your teeth with a soft toothbrush and be gentle when you floss. Contact a health care provider if:  You have dizziness.  You have mild pelvic cramps, pelvic pressure, or nagging pain in the abdominal area.  You have persistent nausea, vomiting, or diarrhea.  You have a bad smelling vaginal discharge.  You have pain when you urinate.  You notice increased swelling in your face, hands, legs, or ankles.  You are exposed to fifth disease or chickenpox.  You are exposed to German measles (rubella) and have never had it. Get help right away if:  You have a fever.  You are leaking fluid from your vagina.  You have spotting or bleeding from your vagina.  You have severe abdominal cramping or pain.  You have rapid weight gain or loss.  You vomit blood or material that looks like coffee grounds.  You develop a severe headache.  You have shortness of breath.  You have any kind of trauma, such as from a fall or a car accident. Summary  The first trimester of pregnancy is from week 1 until the end of week 13 (months 1 through 3).  Your body goes through many changes during pregnancy. The changes vary from  woman to woman.  You will have routine prenatal visits. During those visits, your health care provider will examine you, discuss any test results you may have, and talk with you about how you are feeling. This information is not intended to replace advice given to you by your health care provider. Make sure you discuss any questions you have with your health care provider. Document Released: 03/23/2001 Document Revised: 03/10/2016 Document Reviewed: 03/10/2016 Elsevier Interactive Patient Education  2017 [employer]   [employer]  

## 2016-10-21 NOTE — MAU Note (Signed)
Phone call from pt to check on results. Pt was triaged but had never been put in a room. Pt states that she had to leave but she was going to try to come back.

## 2016-10-21 NOTE — MAU Note (Signed)
Pt left and is back now. C/O abd pain. U/S ordered prior to her leaving . U/S ready to scan her.

## 2016-10-22 LAB — HIV ANTIBODY (ROUTINE TESTING W REFLEX): HIV Screen 4th Generation wRfx: NONREACTIVE

## 2016-10-23 ENCOUNTER — Inpatient Hospital Stay (HOSPITAL_COMMUNITY)
Admission: AD | Admit: 2016-10-23 | Discharge: 2016-10-23 | Disposition: A | Discharge status: Home / Self Care | Payer: Medicaid Other | Source: Ambulatory Visit | Attending: Obstetrics and Gynecology | Admitting: Obstetrics and Gynecology

## 2016-10-23 DIAGNOSIS — O99334 Smoking (tobacco) complicating childbirth: Secondary | ICD-10-CM | POA: Diagnosis not present

## 2016-10-23 DIAGNOSIS — Z3A01 Less than 8 weeks gestation of pregnancy: Secondary | ICD-10-CM | POA: Insufficient documentation

## 2016-10-23 DIAGNOSIS — F17210 Nicotine dependence, cigarettes, uncomplicated: Secondary | ICD-10-CM | POA: Insufficient documentation

## 2016-10-23 DIAGNOSIS — R109 Unspecified abdominal pain: Secondary | ICD-10-CM

## 2016-10-23 DIAGNOSIS — O26891 Other specified pregnancy related conditions, first trimester: Secondary | ICD-10-CM | POA: Insufficient documentation

## 2016-10-23 DIAGNOSIS — O9989 Other specified diseases and conditions complicating pregnancy, childbirth and the puerperium: Secondary | ICD-10-CM

## 2016-10-23 DIAGNOSIS — Z3490 Encounter for supervision of normal pregnancy, unspecified, unspecified trimester: Secondary | ICD-10-CM

## 2016-10-23 LAB — HCG, QUANTITATIVE, PREGNANCY: hCG, Beta Chain, Quant, S: 6067 m[IU]/mL — ABNORMAL HIGH (ref <5)

## 2016-10-23 NOTE — MAU Note (Signed)
Here for repeat labs.  Doing ok physically, denies pain or bleeding. FOB has moved out and is denying preg.

## 2016-10-23 NOTE — MAU Provider Note (Signed)
History   [id number] in for repeat quant. Pt denies any bleeding or abd pain.  CSN: [id number]  Arrival date & time 10/23/16  1636   None     Chief Complaint  Patient presents with  . Follow-up    HPI  Past Medical History:  Diagnosis Date  . History of chicken pox   . History of chlamydia   . Ruptured intervertebral disc    S1    Past Surgical History:  Procedure Laterality Date  . BREAST SURGERY     2 llumps remosved in 2007  . CHOLECYSTECTOMY  2010    Family History  Problem Relation Age of Onset  . Diabetes Maternal Grandmother   . Hypertension Maternal Grandmother   . Diabetes Paternal Grandmother   . Hypertension Paternal Grandmother   . Diabetes Father   . Cancer Paternal Aunt     Social History  Substance Use Topics  . Smoking status: Current Every Day Smoker    Packs/day: 0.50    Types: Cigarettes  . Smokeless tobacco: Never Used  . Alcohol use No    OB History    Gravida Para Term Preterm AB Living   7 5 5   1 5    SAB TAB Ectopic Multiple Live Births   1     0 5      Review of Systems  Constitutional: Negative.   HENT: Negative.   Eyes: Negative.   Respiratory: Negative.   Cardiovascular: Negative.   Gastrointestinal: Negative.   Endocrine: Negative.   Genitourinary: Negative.   Musculoskeletal: Negative.   Skin: Negative.   Allergic/Immunologic: Negative.   Neurological: Negative.   Hematological: Negative.   Psychiatric/Behavioral: Negative.     Allergies  Patient has no known allergies.  Home Medications    BP 109/77 (BP Location: Right Arm)   Pulse 85   Temp 98.8 F (37.1 C) (Oral)   Resp 18   LMP 09/16/2016   SpO2 100%   Physical Exam  Constitutional: She is oriented to person, place, and time. She appears well-developed and well-nourished.  HENT:  Head: Normocephalic.  Cardiovascular: Normal rate.   Pulmonary/Chest: Effort normal.  Abdominal: Soft.  Musculoskeletal: Normal range of motion.  Neurological: She  is alert and oriented to person, place, and time. She has normal reflexes.  Skin: Skin is warm and dry.  Psychiatric: She has a normal mood and affect. Her behavior is normal. Judgment and thought content normal.    MAU Course  Procedures (including critical care time)  Labs Reviewed  HCG, QUANTITATIVE, PREGNANCY - Abnormal; Notable for the following:       Result Value   hCG, Beta Chain, Quant, S 6,067 (*)    All other components within normal limits   No results found.   1. Pregnancy at early stage       MDM  Quant today 6067 up from 3869. VSS. Will d/c home to follow up with provider as scheduled.

## 2016-11-01 ENCOUNTER — Ambulatory Visit (HOSPITAL_COMMUNITY)
Admission: RE | Admit: 2016-11-01 | Discharge: 2016-11-01 | Disposition: A | Discharge status: Home / Self Care | Payer: Medicaid Other | Source: Ambulatory Visit | Attending: Advanced Practice Midwife | Admitting: Advanced Practice Midwife

## 2016-11-01 ENCOUNTER — Ambulatory Visit: Payer: Self-pay | Admitting: Lab

## 2016-11-01 ENCOUNTER — Encounter: Payer: Self-pay | Admitting: General Practice

## 2016-11-01 DIAGNOSIS — R109 Unspecified abdominal pain: Secondary | ICD-10-CM | POA: Diagnosis not present

## 2016-11-01 DIAGNOSIS — O26891 Other specified pregnancy related conditions, first trimester: Secondary | ICD-10-CM

## 2016-11-01 DIAGNOSIS — O3680X0 Pregnancy with inconclusive fetal viability, not applicable or unspecified: Secondary | ICD-10-CM

## 2016-11-01 DIAGNOSIS — O208 Other hemorrhage in early pregnancy: Secondary | ICD-10-CM | POA: Diagnosis not present

## 2016-11-01 DIAGNOSIS — Z3A01 Less than 8 weeks gestation of pregnancy: Secondary | ICD-10-CM | POA: Diagnosis not present

## 2016-11-01 DIAGNOSIS — Z363 Encounter for antenatal screening for malformations: Secondary | ICD-10-CM

## 2016-11-01 NOTE — Progress Notes (Signed)
Patient was here today to receive Ultrasound results. Per Dr. Adrian BlackwaterStinson patient US looks good and she doesn't need to be for another 4 weeks. At 10 weeks patient needs to be seen for New OB visit

## 2016-12-21 LAB — OB RESULTS CONSOLE GC/CHLAMYDIA
CHLAMYDIA, DNA PROBE: NEGATIVE
GC PROBE AMP, GENITAL: NEGATIVE

## 2016-12-21 LAB — OB RESULTS CONSOLE GBS: STREP GROUP B AG: POSITIVE

## 2016-12-21 LAB — OB RESULTS CONSOLE ABO/RH: RH Type: POSITIVE

## 2016-12-21 LAB — OB RESULTS CONSOLE HIV ANTIBODY (ROUTINE TESTING): HIV: NONREACTIVE

## 2016-12-21 LAB — OB RESULTS CONSOLE ANTIBODY SCREEN: Antibody Screen: NEGATIVE

## 2016-12-21 LAB — OB RESULTS CONSOLE RPR: RPR: NONREACTIVE

## 2016-12-21 LAB — OB RESULTS CONSOLE RUBELLA ANTIBODY, IGM: Rubella: IMMUNE

## 2016-12-21 LAB — OB RESULTS CONSOLE HEPATITIS B SURFACE ANTIGEN: Hepatitis B Surface Ag: NEGATIVE

## 2017-03-02 ENCOUNTER — Other Ambulatory Visit: Payer: Self-pay

## 2017-03-02 ENCOUNTER — Inpatient Hospital Stay (HOSPITAL_COMMUNITY)
Admission: AD | Admit: 2017-03-02 | Discharge: 2017-03-02 | Discharge status: Against Medical Advice | Payer: Medicaid Other | Source: Ambulatory Visit | Attending: Obstetrics and Gynecology | Admitting: Obstetrics and Gynecology

## 2017-03-02 ENCOUNTER — Inpatient Hospital Stay (HOSPITAL_COMMUNITY): Payer: Medicaid Other

## 2017-03-02 ENCOUNTER — Encounter (HOSPITAL_COMMUNITY): Payer: Self-pay

## 2017-03-02 DIAGNOSIS — Z8619 Personal history of other infectious and parasitic diseases: Secondary | ICD-10-CM | POA: Diagnosis not present

## 2017-03-02 DIAGNOSIS — F17210 Nicotine dependence, cigarettes, uncomplicated: Secondary | ICD-10-CM | POA: Diagnosis not present

## 2017-03-02 DIAGNOSIS — O99332 Smoking (tobacco) complicating pregnancy, second trimester: Secondary | ICD-10-CM | POA: Insufficient documentation

## 2017-03-02 DIAGNOSIS — Z833 Family history of diabetes mellitus: Secondary | ICD-10-CM | POA: Insufficient documentation

## 2017-03-02 DIAGNOSIS — Z9049 Acquired absence of other specified parts of digestive tract: Secondary | ICD-10-CM | POA: Diagnosis not present

## 2017-03-02 DIAGNOSIS — Z809 Family history of malignant neoplasm, unspecified: Secondary | ICD-10-CM | POA: Diagnosis not present

## 2017-03-02 DIAGNOSIS — Z3A23 23 weeks gestation of pregnancy: Secondary | ICD-10-CM | POA: Diagnosis not present

## 2017-03-02 DIAGNOSIS — O9A212 Injury, poisoning and certain other consequences of external causes complicating pregnancy, second trimester: Secondary | ICD-10-CM | POA: Diagnosis not present

## 2017-03-02 DIAGNOSIS — T1490XA Injury, unspecified, initial encounter: Secondary | ICD-10-CM | POA: Diagnosis not present

## 2017-03-02 DIAGNOSIS — Z8249 Family history of ischemic heart disease and other diseases of the circulatory system: Secondary | ICD-10-CM | POA: Diagnosis not present

## 2017-03-02 DIAGNOSIS — W010XXA Fall on same level from slipping, tripping and stumbling without subsequent striking against object, initial encounter: Secondary | ICD-10-CM | POA: Diagnosis not present

## 2017-03-02 DIAGNOSIS — Z9889 Other specified postprocedural states: Secondary | ICD-10-CM | POA: Diagnosis not present

## 2017-03-02 DIAGNOSIS — W19XXXA Unspecified fall, initial encounter: Secondary | ICD-10-CM | POA: Diagnosis not present

## 2017-03-02 DIAGNOSIS — R109 Unspecified abdominal pain: Secondary | ICD-10-CM | POA: Insufficient documentation

## 2017-03-02 NOTE — MAU Provider Note (Signed)
  History     CSN: [id number]  Arrival date and time: 03/02/17 1448   First Provider Initiated Contact with Patient 03/02/17 1536      Chief Complaint  Patient presents with  . Fall   [id number] @23 .6 wks here after fall. She slipped on a grate and fell forward on right side of abdomen around 0830 today. No LOC or head trauma. Denies VB or LOF. Feeling about 2 ctx since fall. Originally didn't feel FM but has since EFM applied.    OB History    Gravida Para Term Preterm AB Living   7 5 5   1 5    SAB TAB Ectopic Multiple Live Births   1     0 5      Past Medical History:  Diagnosis Date  . History of chicken pox   . History of chlamydia   . Ruptured intervertebral disc    S1    Past Surgical History:  Procedure Laterality Date  . BREAST SURGERY     2 llumps remosved in 2007  . CHOLECYSTECTOMY  2010    Family History  Problem Relation Age of Onset  . Diabetes Maternal Grandmother   . Hypertension Maternal Grandmother   . Diabetes Paternal Grandmother   . Hypertension Paternal Grandmother   . Diabetes Father   . Cancer Paternal Aunt     Social History   Tobacco Use  . Smoking status: Current Every Day Smoker    Packs/day: 0.50    Types: Cigarettes  . Smokeless tobacco: Never Used  Substance Use Topics  . Alcohol use: No  . Drug use: No    Allergies: No Known Allergies  No medications prior to admission.    Review of Systems  Gastrointestinal: Positive for abdominal pain.  Genitourinary: Negative for vaginal bleeding.   Physical Exam   Blood pressure (!) 98/58, temperature 98.7 F (37.1 C), resp. rate 20, last menstrual period 09/16/2016, SpO2 100 %, unknown if currently breastfeeding.  Physical Exam  Constitutional: She is oriented to person, place, and time. She appears well-developed and well-nourished. No distress (appears comfortable).  HENT:  Head: Normocephalic and atraumatic.  Neck: Normal range of motion.  Cardiovascular: Normal rate.   Respiratory: Effort normal. No respiratory distress.  GI: Soft. She exhibits no distension. There is tenderness in the suprapubic area.  Genitourinary:  Genitourinary Comments: SVE closed/thick  Musculoskeletal: Normal range of motion.  Neurological: She is alert and oriented to person, place, and time.  Skin: Skin is warm and dry.  Psychiatric: She has a normal mood and affect.  EFM: 125 bpm, mod variability, + accels, no decels Toco: none  MAU Course  Procedures Prolonged EFM  MDM US ordered>no evidence of abruption. Discussed US findings with pt. Discussed recommendation for prolonged EFM/obs as abruptions don't always present immediately and US doesn't always detect this. Pt cannot stay for 4 hr obs d/t child care issues. Will leave AMA. Discussed abruption precautions. Presentation, clinical findings, and plan discussed with Dr. Ellyn HackBovard.   Assessment and Plan   1. Fall   2. [redacted] weeks gestation of pregnancy    [facility]   Donette LarryMelanie Ethelda Deangelo, [place] 03/02/2017, 4:54 PM

## 2017-03-02 NOTE — MAU Provider Note (Signed)
120-125, mod var, + accels, category 1 - R for gestational age

## 2017-03-02 NOTE — MAU Note (Signed)
Pt states that she was walking up a hill and at 0830 and slipped on a grill grate. She states that she could not come in any earlier due to childcare issues.

## 2017-04-12 NOTE — L&D Delivery Note (Signed)
Delivery Note Pt was sitting up waiting for epidural when reported a strong urge to push. Noted to be complete. I was notified and immediately headed to the hospital.  On arrival 2-573minutes after called, pt had delivered precipitously. Baby was on mothers chest in stable condition. Dr Shawnie PonsPratt was at bedside. I took over, obtained cord blood and delivered placenta in shultz. No lacerations noted. Mother and baby stable  At 11:21 AM a viable female was delivered via Vaginal, Spontaneous (Presentation:LOA ;  ).  APGAR:9,9 , ; weight pending .   Placenta status:delivered shultz , .  Cord:  with the following complications:none 3vc .  Cord pH: n/a  Anesthesia:  none Episiotomy: None Lacerations: None Est. Blood Loss (mL): 100  Mom to postpartum.  Baby to Couplet care / Skin to Skin.  Cathrine MusterCecilia W Aashish Hamm 06/13/2017, 11:40 AM

## 2017-04-16 ENCOUNTER — Inpatient Hospital Stay (HOSPITAL_COMMUNITY)
Admission: AD | Admit: 2017-04-16 | Discharge: 2017-04-16 | Disposition: A | Discharge status: Home / Self Care | Payer: Medicaid Other | Source: Ambulatory Visit | Attending: Obstetrics and Gynecology | Admitting: Obstetrics and Gynecology

## 2017-04-16 ENCOUNTER — Encounter (HOSPITAL_COMMUNITY): Payer: Self-pay | Admitting: *Deleted

## 2017-04-16 DIAGNOSIS — O26893 Other specified pregnancy related conditions, third trimester: Secondary | ICD-10-CM | POA: Diagnosis not present

## 2017-04-16 DIAGNOSIS — O9989 Other specified diseases and conditions complicating pregnancy, childbirth and the puerperium: Secondary | ICD-10-CM | POA: Diagnosis not present

## 2017-04-16 DIAGNOSIS — J069 Acute upper respiratory infection, unspecified: Secondary | ICD-10-CM

## 2017-04-16 DIAGNOSIS — R0602 Shortness of breath: Secondary | ICD-10-CM | POA: Diagnosis present

## 2017-04-16 DIAGNOSIS — Z3A30 30 weeks gestation of pregnancy: Secondary | ICD-10-CM | POA: Diagnosis not present

## 2017-04-16 DIAGNOSIS — R05 Cough: Secondary | ICD-10-CM | POA: Diagnosis present

## 2017-04-16 DIAGNOSIS — O99333 Smoking (tobacco) complicating pregnancy, third trimester: Secondary | ICD-10-CM | POA: Diagnosis not present

## 2017-04-16 LAB — CBC
HEMATOCRIT: 31.4 % — AB (ref 36.0–46.0)
HEMOGLOBIN: 10.8 g/dL — AB (ref 12.0–15.0)
MCH: 32 pg (ref 26.0–34.0)
MCHC: 34.4 g/dL (ref 30.0–36.0)
MCV: 93.2 fL (ref 78.0–100.0)
Platelets: 297 10*3/uL (ref 150–400)
RBC: 3.37 MIL/uL — ABNORMAL LOW (ref 3.87–5.11)
RDW: 13.3 % (ref 11.5–15.5)
WBC: 16 10*3/uL — ABNORMAL HIGH (ref 4.0–10.5)

## 2017-04-16 LAB — RAPID STREP SCREEN (MED CTR MEBANE ONLY): Streptococcus, Group A Screen (Direct): NEGATIVE

## 2017-04-16 LAB — INFLUENZA PANEL BY PCR (TYPE A & B)
INFLBPCR: NEGATIVE
Influenza A By PCR: NEGATIVE

## 2017-04-16 MED ORDER — BENZONATATE 100 MG PO CAPS: 100.0000 mg | ORAL_CAPSULE | Freq: Three times a day (TID) | ORAL | 0 refills | Status: DC

## 2017-04-16 MED ORDER — BENZONATATE 100 MG PO CAPS: 100.0000 mg | ORAL_CAPSULE | Freq: Once | ORAL | Status: DC

## 2017-04-16 MED ORDER — BENZONATATE 100 MG PO CAPS
200.0000 mg | ORAL_CAPSULE | Freq: Once | ORAL | Status: AC
  Administered 2017-04-16: 200 mg via ORAL
  Filled 2017-04-16: qty 2

## 2017-04-16 NOTE — MAU Note (Signed)
Pt reports she has been having a cough and chest congestion since yesterday.Chest feels tight at times. Stated she felt hot this morning but did not take temp. Good fetal movement reported. Took a little bit of nightquil last night

## 2017-04-16 NOTE — Discharge Instructions (Signed)
Upper Respiratory Infection, Adult Most upper respiratory infections (URIs) are caused by a virus. A URI affects the nose, throat, and upper air passages. The most common type of URI is often called "the common cold." Follow these instructions at home:  Take medicines only as told by your doctor.  Gargle warm saltwater or take cough drops to comfort your throat as told by your doctor.  Use a warm mist humidifier or inhale steam from a shower to increase air moisture. This may make it easier to breathe.  Drink enough fluid to keep your pee (urine) clear or pale yellow.  Eat soups and other clear broths.  Have a healthy diet.  Rest as needed.  Go back to work when your fever is gone or your doctor says it is okay. ? You may need to stay home longer to avoid giving your URI to others. ? You can also wear a face mask and wash your hands often to prevent spread of the virus.  Use your inhaler more if you have asthma.  Do not use any tobacco products, including cigarettes, chewing tobacco, or electronic cigarettes. If you need help quitting, ask your doctor. Contact a doctor if:  You are getting worse, not better.  Your symptoms are not helped by medicine.  You have chills.  You are getting more short of breath.  You have brown or red mucus.  You have yellow or brown discharge from your nose.  You have pain in your face, especially when you bend forward.  You have a fever.  You have puffy (swollen) neck glands.  You have pain while swallowing.  You have white areas in the back of your throat. Get help right away if:  You have very bad or constant: ? Headache. ? Ear pain. ? Pain in your forehead, behind your eyes, and over your cheekbones (sinus pain). ? Chest pain.  You have long-lasting (chronic) lung disease and any of the following: ? Wheezing. ? Long-lasting cough. ? Coughing up blood. ? A change in your usual mucus.  You have a stiff neck.  You have  changes in your: ? Vision. ? Hearing. ? Thinking. ? Mood. This information is not intended to replace advice given to you by your health care provider. Make sure you discuss any questions you have with your health care provider. Document Released: 09/15/2007 Document Revised: 11/30/2015 Document Reviewed: 07/04/2013 Elsevier Interactive Patient Education  2018 Elsevier Inc.  

## 2017-04-16 NOTE — MAU Provider Note (Signed)
Patient [name] is a 32 y.o. [id number] At 5655w2d here with complaints of congestion, sore throat and headache. She says it hurts to breath because she is congested. She is coughing a lot and it is making her throw up.   She denies bleeding, leaking of fluid, or decreased fetal movements.    History     CSN: [id number]  Arrival date and time: 04/16/17 1750   None     Chief Complaint  Patient presents with  . Cough  . Shortness of Breath   Cough  This is a new problem. The current episode started yesterday. The problem has been unchanged. Associated symptoms include a fever, headaches, nasal congestion, postnasal drip, rhinorrhea and a sore throat. Pertinent negatives include no shortness of breath. Nothing aggravates the symptoms.  Shortness of Breath  Associated symptoms include a fever, headaches, rhinorrhea and a sore throat.   Patient says that she started feeling sick last night. Her friend is also sick. She says she felt warm at home, but she didn't take her temperature. She has been coughing hard and that is making her throw up.  OB History    Gravida Para Term Preterm AB Living   7 5 5   1 5    SAB TAB Ectopic Multiple Live Births   1     0 5      Past Medical History:  Diagnosis Date  . History of chicken pox   . History of chlamydia   . Ruptured intervertebral disc    S1    Past Surgical History:  Procedure Laterality Date  . BREAST SURGERY     2 llumps remosved in 2007  . CHOLECYSTECTOMY  2010    Family History  Problem Relation Age of Onset  . Diabetes Maternal Grandmother   . Hypertension Maternal Grandmother   . Diabetes Paternal Grandmother   . Hypertension Paternal Grandmother   . Diabetes Father   . Cancer Paternal Aunt     Social History   Tobacco Use  . Smoking status: Current Every Day Smoker    Packs/day: 0.50    Types: Cigarettes  . Smokeless tobacco: Never Used  Substance Use Topics  . Alcohol use: No  . Drug use: No     Allergies: No Known Allergies  Medications Prior to Admission  Medication Sig Dispense Refill Last Dose  . ampicillin (PRINCIPEN) 500 MG capsule Take 500 mg by mouth 4 (four) times daily. Pt got this filled on 02/28/17 to take for 5 days.   03/02/2017 at Unknown time  . oxyCODONE-acetaminophen (PERCOCET/ROXICET) 5-325 MG tablet Take 1 tablet by mouth every 4 (four) hours as needed for severe pain.   02/23/2017  . Prenatal Vit-Fe Fumarate-FA (MULTIVITAMIN-PRENATAL) 27-0.8 MG TABS tablet Take 1 tablet by mouth daily at 12 noon.   02/25/2017    Review of Systems  Constitutional: Positive for fever.  HENT: Positive for postnasal drip, rhinorrhea and sore throat.   Respiratory: Positive for cough. Negative for shortness of breath.   Genitourinary: Negative.   Musculoskeletal: Negative.   Neurological: Positive for headaches.   Physical Exam   Blood pressure 110/70, pulse 97, temperature 99 F (37.2 C), temperature source Oral, resp. rate 18, last menstrual period 09/16/2016, SpO2 95 %, unknown if currently breastfeeding.  Physical Exam  Constitutional: She is oriented to person, place, and time. She appears well-developed.  HENT:  Head: Normocephalic.  Nose: Nose normal.  Mouth/Throat: Oropharynx is clear and moist. No oropharyngeal exudate.  No  spots, lesions or exudate in back of throat. Pearly gray TM. No tragal tenderness.   Neck: Normal range of motion.  Cardiovascular: Normal rate.  Respiratory: Effort normal. No respiratory distress. She has no wheezes. She has no rales. She exhibits no tenderness.  GI: Soft.  Lymphadenopathy:    She has no cervical adenopathy.  Neurological: She is alert and oriented to person, place, and time.  Skin: Skin is warm and dry.  Psychiatric: She has a normal mood and affect.    MAU Course  Procedures  MDM Patient does not want a breathing treatment; states that she wishes she could quit coughing.   Flu test is negative; strep is  pending. Lungs are clear; patient has been on pulse ox. She has a low-grade fever of 99.  Vitals:   04/16/17 1815  BP: 110/70  Pulse: 97  Resp: 18  Temp: 99 F (37.2 C)  SpO2: 95%   -tessalon perle given.   Case discussed with Dr. Jackelyn Knife, who agrees that patient is stable for discharge with comfort measures and plans to keep her appointment on Monday.   -NST: 120 bpm, mod variability, present acel, no decels, no contractions.  Assessment and Plan   1. Viral upper respiratory tract infection    2. Patient stable for discharge with labor precautions and comfort measures.  3. Patient plans to keep appointment with ob on 04-18-2017.   Charlesetta Garibaldi Lorrinda Ramstad 04/16/2017, 7:29 PM

## 2017-04-19 LAB — CULTURE, GROUP A STREP (THRC)

## 2017-05-06 ENCOUNTER — Other Ambulatory Visit: Payer: Self-pay

## 2017-05-06 ENCOUNTER — Encounter (HOSPITAL_COMMUNITY): Payer: Self-pay

## 2017-05-06 ENCOUNTER — Inpatient Hospital Stay (HOSPITAL_COMMUNITY)
Admission: AD | Admit: 2017-05-06 | Discharge: 2017-05-06 | Disposition: A | Discharge status: Home / Self Care | Payer: Medicaid Other | Source: Ambulatory Visit | Attending: Obstetrics and Gynecology | Admitting: Obstetrics and Gynecology

## 2017-05-06 DIAGNOSIS — O99333 Smoking (tobacco) complicating pregnancy, third trimester: Secondary | ICD-10-CM | POA: Diagnosis not present

## 2017-05-06 DIAGNOSIS — O4703 False labor before 37 completed weeks of gestation, third trimester: Secondary | ICD-10-CM | POA: Diagnosis not present

## 2017-05-06 DIAGNOSIS — Z3A33 33 weeks gestation of pregnancy: Secondary | ICD-10-CM

## 2017-05-06 DIAGNOSIS — B9689 Other specified bacterial agents as the cause of diseases classified elsewhere: Secondary | ICD-10-CM | POA: Diagnosis not present

## 2017-05-06 DIAGNOSIS — F17210 Nicotine dependence, cigarettes, uncomplicated: Secondary | ICD-10-CM | POA: Diagnosis not present

## 2017-05-06 DIAGNOSIS — N760 Acute vaginitis: Secondary | ICD-10-CM

## 2017-05-06 DIAGNOSIS — O23593 Infection of other part of genital tract in pregnancy, third trimester: Secondary | ICD-10-CM | POA: Insufficient documentation

## 2017-05-06 LAB — URINALYSIS, ROUTINE W REFLEX MICROSCOPIC
BILIRUBIN URINE: NEGATIVE
GLUCOSE, UA: NEGATIVE mg/dL
HGB URINE DIPSTICK: NEGATIVE
KETONES UR: NEGATIVE mg/dL
NITRITE: NEGATIVE
PH: 7 (ref 5.0–8.0)
Protein, ur: NEGATIVE mg/dL
Specific Gravity, Urine: 1.006 (ref 1.005–1.030)

## 2017-05-06 LAB — WET PREP, GENITAL
SPERM: NONE SEEN
TRICH WET PREP: NONE SEEN
Yeast Wet Prep HPF POC: NONE SEEN

## 2017-05-06 MED ORDER — METRONIDAZOLE 500 MG PO TABS: 500.0000 mg | ORAL_TABLET | Freq: Two times a day (BID) | ORAL | 0 refills | Status: DC

## 2017-05-06 NOTE — MAU Note (Signed)
Having ctxs and sharp pain for a month. 1cm. Having sharp pains in my vagina. Denies LOF or bleeding

## 2017-05-06 NOTE — MAU Provider Note (Signed)
History    CSN: [id number]  Arrival date and time: 05/06/17 2006   First Provider Initiated Contact with Patient 05/06/17 2112     Chief Complaint  Patient presents with  . Contractions   HPI [name] is a 32 y.o. [id number] at 2162w1d who presents with contractions. She states the contractions started a month ago and got worse the last 2 days. She states she has 6-7 contractions an hour. She also reports spotting when she wiped. She reports last intercourse last night. Denies any leaking of fluid. Reports good fetal movement. No history of preterm deliveries.   OB History    Gravida Para Term Preterm AB Living   7 5 5   1 5    SAB TAB Ectopic Multiple Live Births   1     0 5      Past Medical History:  Diagnosis Date  . History of chicken pox   . History of chlamydia   . Ruptured intervertebral disc    S1    Past Surgical History:  Procedure Laterality Date  . BREAST SURGERY     2 llumps remosved in 2007  . CHOLECYSTECTOMY  2010    Family History  Problem Relation Age of Onset  . Diabetes Maternal Grandmother   . Hypertension Maternal Grandmother   . Diabetes Paternal Grandmother   . Hypertension Paternal Grandmother   . Diabetes Father   . Cancer Paternal Aunt     Social History   Tobacco Use  . Smoking status: Current Every Day Smoker    Packs/day: 0.50    Types: Cigarettes  . Smokeless tobacco: Never Used  Substance Use Topics  . Alcohol use: No  . Drug use: No    Allergies: No Known Allergies  Medications Prior to Admission  Medication Sig Dispense Refill Last Dose  . oxyCODONE-acetaminophen (PERCOCET/ROXICET) 5-325 MG tablet Take 1 tablet by mouth every 4 (four) hours as needed for severe pain.   Past Week at Unknown time  . Prenatal Vit-Fe Fumarate-FA (MULTIVITAMIN-PRENATAL) 27-0.8 MG TABS tablet Take 1 tablet by mouth daily at 12 noon.   05/05/2017 at Unknown time  . ranitidine (ZANTAC) 150 MG tablet Take 300 mg by mouth daily.   05/06/2017 at  Unknown time  . benzonatate (TESSALON) 100 MG capsule Take 1 capsule (100 mg total) by mouth every 8 (eight) hours. 21 capsule 0     Review of Systems  Constitutional: Negative.  Negative for fatigue and fever.  HENT: Negative.   Respiratory: Negative.  Negative for shortness of breath.   Cardiovascular: Negative.  Negative for chest pain.  Gastrointestinal: Positive for abdominal pain. Negative for constipation, diarrhea, nausea and vomiting.  Genitourinary: Positive for vaginal bleeding. Negative for dysuria and vaginal discharge.  Neurological: Negative.  Negative for dizziness and headaches.   Physical Exam   Blood pressure 122/69, pulse 94, temperature 98.5 F (36.9 C), resp. rate 18, height 5\' 3"  (1.6 m), weight 148 lb (67.1 kg), last menstrual period 09/16/2016, unknown if currently breastfeeding.  Physical Exam  Nursing note and vitals reviewed. Constitutional: She is oriented to person, place, and time. She appears well-developed and well-nourished. No distress.  HENT:  Head: Normocephalic.  Eyes: Pupils are equal, round, and reactive to light.  Cardiovascular: Normal rate, regular rhythm and normal heart sounds.  Respiratory: Effort normal and breath sounds normal. No respiratory distress.  GI: Soft. Bowel sounds are normal. She exhibits no distension. There is no tenderness.  Neurological: She is alert and  oriented to person, place, and time.  Skin: Skin is warm and dry.  Psychiatric: She has a normal mood and affect. Her behavior is normal. Judgment and thought content normal.   Fetal Tracing:  Baseline: 115 Variability: moderate Accels: 15x15 Decels: none  Toco: 1 uc  Dilation: 1 Effacement (%): 50 Exam by:: Cleone Slim CNM   MAU Course  Procedures Results for orders placed or performed during the hospital encounter of 05/06/17 (from the past 24 hour(s))  Urinalysis, Routine w reflex microscopic     Status: Abnormal   Collection Time: 05/06/17  8:30 PM   Result Value Ref Range   Color, Urine YELLOW YELLOW   APPearance CLEAR CLEAR   Specific Gravity, Urine 1.006 1.005 - 1.030   pH 7.0 5.0 - 8.0   Glucose, UA NEGATIVE NEGATIVE mg/dL   Hgb urine dipstick NEGATIVE NEGATIVE   Bilirubin Urine NEGATIVE NEGATIVE   Ketones, ur NEGATIVE NEGATIVE mg/dL   Protein, ur NEGATIVE NEGATIVE mg/dL   Nitrite NEGATIVE NEGATIVE   Leukocytes, UA SMALL (A) NEGATIVE   RBC / HPF 0-5 0 - 5 RBC/hpf   WBC, UA 0-5 0 - 5 WBC/hpf   Bacteria, UA RARE (A) NONE SEEN   Squamous Epithelial / LPF 6-30 (A) NONE SEEN  Wet prep, genital     Status: Abnormal   Collection Time: 05/06/17  9:50 PM  Result Value Ref Range   Yeast Wet Prep HPF POC NONE SEEN NONE SEEN   Trich, Wet Prep NONE SEEN NONE SEEN   Clue Cells Wet Prep HPF POC PRESENT (A) NONE SEEN   WBC, Wet Prep HPF POC MANY (A) NONE SEEN   Sperm NONE SEEN    MDM UA, UPT Wet prep and gc/chlamydia Reviewed with Dr. Senaida Ores- ok to discharge patient home to follow up in the office as scheduled.   Assessment and Plan   1. Preterm uterine contractions in third trimester, antepartum   2. Bacterial vaginosis    -Discharge home in stable condition -Rx for metronidazole sent to patient's pharmacy -Preterm labor precautions discussed -Patient advised to follow-up with [facility] as scheduled for prenatal care -Patient may return to MAU as needed or if her condition were to change or worsen  Rolm Bookbinder CNM 05/06/2017, 10:10 PM   Allergies as of 05/06/2017   No Known Allergies     Medication List    STOP taking these medications   benzonatate 100 MG capsule Commonly known as:  TESSALON     TAKE these medications   metroNIDAZOLE 500 MG tablet Commonly known as:  FLAGYL Take 1 tablet (500 mg total) by mouth 2 (two) times daily.   multivitamin-prenatal 27-0.8 MG Tabs tablet Take 1 tablet by mouth daily at 12 noon.   oxyCODONE-acetaminophen 5-325 MG tablet Commonly known as:   PERCOCET/ROXICET Take 1 tablet by mouth every 4 (four) hours as needed for severe pain.   ranitidine 150 MG tablet Commonly known as:  ZANTAC Take 300 mg by mouth daily.

## 2017-05-06 NOTE — MAU Provider Note (Signed)
FHR tracing reviewed and Category 1, occasional contractions

## 2017-05-06 NOTE — MAU Note (Signed)
Pt reports sharp abdominal pain and contractions every 5-6 mins. States contractions started qbout a month ago but worse tonight. States she had some spotting but it went away. Reports good fetal movement. Pt denies LOF, but reports some thick, white discharge. States she was 1/50 1 month ago.

## 2017-05-06 NOTE — Discharge Instructions (Signed)
Bacterial Vaginosis °Bacterial vaginosis is an infection of the vagina. It happens when too many germs (bacteria) grow in the vagina. This infection puts you at risk for infections from sex (STIs). Treating this infection can lower your risk for some STIs. You should also treat this if you are pregnant. It can cause your baby to be born early. °Follow these instructions at home: °Medicines °· Take over-the-counter and prescription medicines only as told by your doctor. °· Take or use your antibiotic medicine as told by your doctor. Do not stop taking or using it even if you start to feel better. °General instructions °· If you your sexual partner is a woman, tell her that you have this infection. She needs to get treatment if she has symptoms. If you have a female partner, he does not need to be treated. °· During treatment: °? Avoid sex. °? Do not douche. °? Avoid alcohol as told. °? Avoid breastfeeding as told. °· Drink enough fluid to keep your pee (urine) clear or pale yellow. °· Keep your vagina and butt (rectum) clean. °? Wash the area with warm water every day. °? Wipe from front to back after you use the toilet. °· Keep all follow-up visits as told by your doctor. This is important. °Preventing this condition °· Do not douche. °· Use only warm water to wash around your vagina. °· Use protection when you have sex. This includes: °? Latex condoms. °? Dental dams. °· Limit how many people you have sex with. It is best to only have sex with the same person (be monogamous). °· Get tested for STIs. Have your partner get tested. °· Wear underwear that is cotton or lined with cotton. °· Avoid tight pants and pantyhose. This is most important in summer. °· Do not use any products that have nicotine or tobacco in them. These include cigarettes and e-cigarettes. If you need help quitting, ask your doctor. °· Do not use illegal drugs. °· Limit how much alcohol you drink. °Contact a doctor if: °· Your symptoms do not get  better, even after you are treated. °· You have more discharge or pain when you pee (urinate). °· You have a fever. °· You have pain in your belly (abdomen). °· You have pain with sex. °· Your bleed from your vagina between periods. °Summary °· This infection happens when too many germs (bacteria) grow in the vagina. °· Treating this condition can lower your risk for some infections from sex (STIs). °· You should also treat this if you are pregnant. It can cause early (premature) birth. °· Do not stop taking or using your antibiotic medicine even if you start to feel better. °This information is not intended to replace advice given to you by your health care provider. Make sure you discuss any questions you have with your health care provider. °Document Released: 01/06/2008 Document Revised: 12/13/2015 Document Reviewed: 12/13/2015 °Elsevier Interactive Patient Education © 2017 Elsevier Inc. ° °Abdominal Pain During Pregnancy °Belly (abdominal) pain is common during pregnancy. Most of the time, it is not a serious problem. Other times, it can be a sign that something is wrong with the pregnancy. Always tell your doctor if you have belly pain. °Follow these instructions at home: °Monitor your belly pain for any changes. The following actions may help you feel better: °· Do not have sex (intercourse) or put anything in your vagina until you feel better. °· Rest until your pain stops. °· Drink clear fluids if you feel sick   sick to your stomach (nauseous). Do not eat solid food until you feel better.  Only take medicine as told by your doctor.  Keep all doctor visits as told.  Get help right away if:  You are bleeding, leaking fluid, or pieces of tissue come out of your vagina.  You have more pain or cramping.  You keep throwing up (vomiting).  You have pain when you pee (urinate) or have blood in your pee.  You have a fever.  You do not feel your baby moving as much.  You feel very weak or feel like  passing out.  You have trouble breathing, with or without belly pain.  You have a very bad headache and belly pain.  You have fluid leaking from your vagina and belly pain.  You keep having watery poop (diarrhea).  Your belly pain does not go away after resting, or the pain gets worse. This information is not intended to replace advice given to you by your health care provider. Make sure you discuss any questions you have with your health care provider. Document Released: 03/17/2009 Document Revised: 11/05/2015 Document Reviewed: 10/26/2012 Elsevier Interactive Patient Education  [employer]2018 Elsevier Inc.

## 2017-05-09 LAB — GC/CHLAMYDIA PROBE AMP (~~LOC~~) NOT AT ARMC
Chlamydia: NEGATIVE
NEISSERIA GONORRHEA: NEGATIVE

## 2017-06-01 ENCOUNTER — Encounter (HOSPITAL_COMMUNITY): Payer: Self-pay | Admitting: *Deleted

## 2017-06-01 ENCOUNTER — Telehealth (HOSPITAL_COMMUNITY): Payer: Self-pay | Admitting: *Deleted

## 2017-06-01 NOTE — Telephone Encounter (Signed)
Preadmission screen  

## 2017-06-12 ENCOUNTER — Other Ambulatory Visit: Payer: Self-pay

## 2017-06-12 ENCOUNTER — Encounter (HOSPITAL_COMMUNITY): Payer: Self-pay

## 2017-06-12 ENCOUNTER — Inpatient Hospital Stay (HOSPITAL_COMMUNITY)
Admission: AD | Admit: 2017-06-12 | Discharge: 2017-06-15 | DRG: 807 | Disposition: A | Discharge status: Home / Self Care | Payer: Medicaid Other | Source: Ambulatory Visit | Attending: Obstetrics and Gynecology | Admitting: Obstetrics and Gynecology

## 2017-06-12 DIAGNOSIS — F17210 Nicotine dependence, cigarettes, uncomplicated: Secondary | ICD-10-CM | POA: Diagnosis present

## 2017-06-12 DIAGNOSIS — O99334 Smoking (tobacco) complicating childbirth: Secondary | ICD-10-CM | POA: Diagnosis present

## 2017-06-12 DIAGNOSIS — O99824 Streptococcus B carrier state complicating childbirth: Secondary | ICD-10-CM | POA: Diagnosis present

## 2017-06-12 DIAGNOSIS — Z3A38 38 weeks gestation of pregnancy: Secondary | ICD-10-CM

## 2017-06-12 DIAGNOSIS — O4292 Full-term premature rupture of membranes, unspecified as to length of time between rupture and onset of labor: Principal | ICD-10-CM | POA: Diagnosis present

## 2017-06-12 DIAGNOSIS — O4290 Premature rupture of membranes, unspecified as to length of time between rupture and onset of labor, unspecified weeks of gestation: Secondary | ICD-10-CM | POA: Diagnosis present

## 2017-06-12 LAB — TYPE AND SCREEN
ABO/RH(D): O POS
ANTIBODY SCREEN: NEGATIVE

## 2017-06-12 LAB — CBC
HCT: 29.9 % — ABNORMAL LOW (ref 36.0–46.0)
HEMOGLOBIN: 10.2 g/dL — AB (ref 12.0–15.0)
MCH: 30.7 pg (ref 26.0–34.0)
MCHC: 34.1 g/dL (ref 30.0–36.0)
MCV: 90.1 fL (ref 78.0–100.0)
Platelets: 410 10*3/uL — ABNORMAL HIGH (ref 150–400)
RBC: 3.32 MIL/uL — AB (ref 3.87–5.11)
RDW: 13.2 % (ref 11.5–15.5)
WBC: 16.5 10*3/uL — ABNORMAL HIGH (ref 4.0–10.5)

## 2017-06-12 MED ORDER — LIDOCAINE HCL (PF) 1 % IJ SOLN
30.0000 mL | INTRAMUSCULAR | Status: DC | PRN
  Filled 2017-06-12: qty 30

## 2017-06-12 MED ORDER — OXYCODONE-ACETAMINOPHEN 5-325 MG PO TABS: 1.0000 | ORAL_TABLET | ORAL | Status: DC | PRN

## 2017-06-12 MED ORDER — LACTATED RINGERS IV SOLN: 500.0000 mL | INTRAVENOUS | Status: DC | PRN

## 2017-06-12 MED ORDER — FENTANYL 2.5 MCG/ML BUPIVACAINE 1/10 % EPIDURAL INFUSION (WH - ANES)
14.0000 mL/h | INTRAMUSCULAR | Status: DC | PRN
  Filled 2017-06-12: qty 100

## 2017-06-12 MED ORDER — SODIUM CHLORIDE 0.9 % IV SOLN
5.0000 10*6.[IU] | Freq: Once | INTRAVENOUS | Status: AC
  Administered 2017-06-12: 5 10*6.[IU] via INTRAVENOUS
  Filled 2017-06-12: qty 5

## 2017-06-12 MED ORDER — ACETAMINOPHEN 325 MG PO TABS: 650.0000 mg | ORAL_TABLET | ORAL | Status: DC | PRN

## 2017-06-12 MED ORDER — TERBUTALINE SULFATE 1 MG/ML IJ SOLN
0.2500 mg | Freq: Once | INTRAMUSCULAR | Status: DC | PRN
  Filled 2017-06-12: qty 1

## 2017-06-12 MED ORDER — EPHEDRINE 5 MG/ML INJ
10.0000 mg | INTRAVENOUS | Status: DC | PRN
  Filled 2017-06-12: qty 2

## 2017-06-12 MED ORDER — LACTATED RINGERS IV SOLN: 500.0000 mL | Freq: Once | INTRAVENOUS | Status: DC

## 2017-06-12 MED ORDER — OXYTOCIN BOLUS FROM INFUSION: 500.0000 mL | Freq: Once | INTRAVENOUS | Status: DC

## 2017-06-12 MED ORDER — OXYCODONE-ACETAMINOPHEN 5-325 MG PO TABS: 2.0000 | ORAL_TABLET | ORAL | Status: DC | PRN

## 2017-06-12 MED ORDER — DIPHENHYDRAMINE HCL 50 MG/ML IJ SOLN: 12.5000 mg | INTRAMUSCULAR | Status: DC | PRN

## 2017-06-12 MED ORDER — BUTORPHANOL TARTRATE 1 MG/ML IJ SOLN: 1.0000 mg | INTRAMUSCULAR | Status: DC | PRN

## 2017-06-12 MED ORDER — PHENYLEPHRINE 40 MCG/ML (10ML) SYRINGE FOR IV PUSH (FOR BLOOD PRESSURE SUPPORT)
80.0000 ug | PREFILLED_SYRINGE | INTRAVENOUS | Status: DC | PRN
  Filled 2017-06-12: qty 5
  Filled 2017-06-12: qty 10

## 2017-06-12 MED ORDER — PHENYLEPHRINE 40 MCG/ML (10ML) SYRINGE FOR IV PUSH (FOR BLOOD PRESSURE SUPPORT)
80.0000 ug | PREFILLED_SYRINGE | INTRAVENOUS | Status: DC | PRN
  Filled 2017-06-12: qty 5

## 2017-06-12 MED ORDER — PENICILLIN G POT IN DEXTROSE 60000 UNIT/ML IV SOLN
3.0000 10*6.[IU] | INTRAVENOUS | Status: DC
  Administered 2017-06-13 (×3): 3 10*6.[IU] via INTRAVENOUS
  Filled 2017-06-12 (×4): qty 50

## 2017-06-12 MED ORDER — OXYTOCIN 40 UNITS IN LACTATED RINGERS INFUSION - SIMPLE MED
1.0000 m[IU]/min | INTRAVENOUS | Status: DC
  Administered 2017-06-12: 2 m[IU]/min via INTRAVENOUS
  Filled 2017-06-12: qty 1000

## 2017-06-12 MED ORDER — LACTATED RINGERS IV SOLN
INTRAVENOUS | Status: DC
  Administered 2017-06-12 – 2017-06-13 (×3): via INTRAVENOUS

## 2017-06-12 MED ORDER — SOD CITRATE-CITRIC ACID 500-334 MG/5ML PO SOLN: 30.0000 mL | ORAL | Status: DC | PRN

## 2017-06-12 MED ORDER — OXYTOCIN 40 UNITS IN LACTATED RINGERS INFUSION - SIMPLE MED: 2.5000 [IU]/h | INTRAVENOUS | Status: DC

## 2017-06-12 MED ORDER — ONDANSETRON HCL 4 MG/2ML IJ SOLN: 4.0000 mg | Freq: Four times a day (QID) | INTRAMUSCULAR | Status: DC | PRN

## 2017-06-12 NOTE — MAU Note (Signed)
Patient presents with leaking fluid all day clear, denies pain.

## 2017-06-12 NOTE — H&P (Signed)
[name] is a 32 y.o. female, G7 P5015, EGA 38+ weeks with EDC 3-13 presenting for leaking fluid since 1200.  Eval in MAU confirme dROM with + fern, minimal ctx.  Prenatal with recent S<D, AGA 33%ile, with nl AFI by u/s.  OB History    Gravida Para Term Preterm AB Living   7 5 5   1 5    SAB TAB Ectopic Multiple Live Births   1     0 5    SVD x 5  Past Medical History:  Diagnosis Date  . History of chicken pox   . History of chlamydia   . Ruptured intervertebral disc    S1   Past Surgical History:  Procedure Laterality Date  . BREAST SURGERY     2 llumps remosved in 2007  . CHOLECYSTECTOMY  2010   Family History: family history includes Cancer in her mother and paternal aunt; Diabetes in her father, maternal grandmother, and paternal grandmother; Hypertension in her maternal grandmother and paternal grandmother. Social History:  reports that she has been smoking cigarettes.  She has been smoking about 1.00 pack per day. she has never used smokeless tobacco. She reports that she does not drink alcohol or use drugs.     Maternal Diabetes: No Genetic Screening: Declined Maternal Ultrasounds/Referrals: Normal Fetal Ultrasounds or other Referrals:  None Maternal Substance Abuse:  No Significant Maternal Medications:  None Significant Maternal Lab Results:  Lab values include: Group B Strep positive Other Comments:  None  Review of Systems  Respiratory: Negative.   Cardiovascular: Negative.    Maternal Medical History:  Reason for admission: Rupture of membranes.   Fetal activity: Perceived fetal activity is normal.    Prenatal complications: no prenatal complications Prenatal Complications - Diabetes: none.    Dilation: 3 Effacement (%): 70 Station: -3 Exam by:: Sade Harpe,RN Blood pressure 113/70, pulse 86, temperature 99 F (37.2 C), temperature source Oral, resp. rate 16, height 5\' 3"  (1.6 m), weight 152 lb 1.9 oz (69 kg), last menstrual period 09/16/2016,  unknown if currently breastfeeding. Maternal Exam:  Uterine Assessment: Contraction strength is mild.  Contraction frequency is irregular.   Abdomen: Patient reports no abdominal tenderness. Estimated fetal weight is 7 lbs.   Fetal presentation: vertex  Introitus: Normal vulva. Normal vagina.  Ferning test: positive.  Amniotic fluid character: clear.  Pelvis: adequate for delivery.   Cervix: Cervix evaluated by digital exam.     Fetal Exam Fetal Monitor Review: Mode: ultrasound.   Baseline rate: 110-120.  Variability: moderate (6-25 bpm).   Pattern: accelerations present and no decelerations.    Fetal State Assessment: Category I - tracings are normal.     Physical Exam  Vitals reviewed. Constitutional: She appears well-developed and well-nourished.  Cardiovascular: Normal rate and regular rhythm.  Respiratory: Effort normal. No respiratory distress.  GI: Soft.    Prenatal labs: ABO, Rh: --/--/O POS (03/03 1936) Antibody: NEG (03/03 1936) Rubella: Immune (09/11 0000) RPR: Nonreactive (09/11 0000)  HBsAg: Negative (09/11 0000)  HIV: Non-reactive (09/11 0000)  GBS: Positive (09/11 0000)   Assessment/Plan: IUP at 38+ weeks with PROM, +GBS.  Will admit, PCN for +GBS, pitocin augmentation of few ctx, anticipate SVD.   Leighton Roachodd D Arrionna Serena 06/12/2017, 9:01 PM

## 2017-06-12 NOTE — MAU Note (Signed)
Urine sent to lab 

## 2017-06-13 ENCOUNTER — Encounter (HOSPITAL_COMMUNITY): Payer: Self-pay | Admitting: *Deleted

## 2017-06-13 MED ORDER — OXYCODONE HCL 5 MG PO TABS
5.0000 mg | ORAL_TABLET | ORAL | Status: DC | PRN
  Administered 2017-06-14 (×2): 5 mg via ORAL
  Filled 2017-06-13 (×2): qty 1

## 2017-06-13 MED ORDER — DIBUCAINE 1 % RE OINT: 1.0000 "application " | TOPICAL_OINTMENT | RECTAL | Status: DC | PRN

## 2017-06-13 MED ORDER — WITCH HAZEL-GLYCERIN EX PADS: 1.0000 "application " | MEDICATED_PAD | CUTANEOUS | Status: DC | PRN

## 2017-06-13 MED ORDER — SENNOSIDES-DOCUSATE SODIUM 8.6-50 MG PO TABS
2.0000 | ORAL_TABLET | ORAL | Status: DC
  Administered 2017-06-13: 2 via ORAL
  Filled 2017-06-13 (×2): qty 2

## 2017-06-13 MED ORDER — AMPICILLIN-SULBACTAM SODIUM 1.5 (1-0.5) G IJ SOLR
1.5000 g | Freq: Four times a day (QID) | INTRAMUSCULAR | Status: DC
  Administered 2017-06-13: 1.5 g via INTRAVENOUS
  Filled 2017-06-13 (×3): qty 1.5

## 2017-06-13 MED ORDER — BENZOCAINE-MENTHOL 20-0.5 % EX AERO
1.0000 "application " | INHALATION_SPRAY | CUTANEOUS | Status: DC | PRN
  Administered 2017-06-13: 1 via TOPICAL
  Filled 2017-06-13: qty 56

## 2017-06-13 MED ORDER — ONDANSETRON HCL 4 MG PO TABS: 4.0000 mg | ORAL_TABLET | ORAL | Status: DC | PRN

## 2017-06-13 MED ORDER — DIPHENHYDRAMINE HCL 25 MG PO CAPS: 25.0000 mg | ORAL_CAPSULE | Freq: Four times a day (QID) | ORAL | Status: DC | PRN

## 2017-06-13 MED ORDER — ACETAMINOPHEN 325 MG PO TABS: 650.0000 mg | ORAL_TABLET | ORAL | Status: DC | PRN

## 2017-06-13 MED ORDER — PRENATAL MULTIVITAMIN CH
1.0000 | ORAL_TABLET | Freq: Every day | ORAL | Status: DC
  Administered 2017-06-14: 1 via ORAL
  Filled 2017-06-13: qty 1

## 2017-06-13 MED ORDER — OXYCODONE HCL 5 MG PO TABS: 10.0000 mg | ORAL_TABLET | ORAL | Status: DC | PRN

## 2017-06-13 MED ORDER — COCONUT OIL OIL: 1.0000 "application " | TOPICAL_OIL | Status: DC | PRN

## 2017-06-13 MED ORDER — IBUPROFEN 600 MG PO TABS
600.0000 mg | ORAL_TABLET | Freq: Four times a day (QID) | ORAL | Status: DC
  Administered 2017-06-13 – 2017-06-15 (×6): 600 mg via ORAL
  Filled 2017-06-13 (×6): qty 1

## 2017-06-13 MED ORDER — ONDANSETRON HCL 4 MG/2ML IJ SOLN: 4.0000 mg | INTRAMUSCULAR | Status: DC | PRN

## 2017-06-13 MED ORDER — SIMETHICONE 80 MG PO CHEW: 80.0000 mg | CHEWABLE_TABLET | ORAL | Status: DC | PRN

## 2017-06-13 MED ORDER — TETANUS-DIPHTH-ACELL PERTUSSIS 5-2.5-18.5 LF-MCG/0.5 IM SUSP: 0.5000 mL | Freq: Once | INTRAMUSCULAR | Status: DC

## 2017-06-13 MED ORDER — ZOLPIDEM TARTRATE 5 MG PO TABS: 5.0000 mg | ORAL_TABLET | Freq: Every evening | ORAL | Status: DC | PRN

## 2017-06-13 NOTE — Lactation Note (Signed)
This note was copied from a baby's chart. Lactation Consultation Note  Patient Name: [name] ZOXWR'UToday's Date: 06/13/2017 Reason for consult: Initial assessment;Early term 37-38.6wks Smoker 1PPD  Visited with P5 Mom of early term infant at 8 hrs.  Mom has already requested formula supplementation, and since has pumped using hand pump and obtained 15 ml.   Mom has poor experience with last baby, nipples cracked and bleeding.  Mom choosing to pump and bottle feed.  Offered assistance with latching, but Mom declined.  Set up DEBP at bedside.  Mom encouraged to double pump for 15-20 mins >8 times per 24 hrs.   Encouraged her to add breast massage and hand expression along with double pumping.  Encouraged baby to be STS as much as possible.  To feed baby EBM 5-10 ml first 24 hrs increasing per guidelines as tolerated.  Lactation brochure given and Mom aware of IP and OP Lactation support available to her.  Encouraged Mom to call prn.  Lactation Tools Discussed/Used Tools: Pump;Bottle Breast pump type: Double-Electric Breast Pump;Manual WIC Program: Yes Pump Review: Setup, frequency, and cleaning;Milk Storage Initiated by:: Erby Pian Khiyan Crace RN IBCLC Date initiated:: 06/13/17   Consult Status Consult Status: Follow-up Date: 06/14/17 Follow-up type: In-patient    Judee ClaraSmith, Najiyah Paris E 06/13/2017, 8:20 PM

## 2017-06-13 NOTE — Progress Notes (Addendum)
Patient ID: Joanne PetitDanielle Garza, female   DOB: [birth year], 32 y.o.   MRN: [id number] Pt comfortable with no complaints. Tolerating pitocin at 20mus with no pain medication. +Fms. No fever/chills VSS EFM - cat 1 TOCO - irreg ctxs SVE - 5.5/801/-2  A/P: [id number] at term with SROM x 21hrs slowly progressing in labor on pitocin       - S/p 4 doses PCN for GBS - stop now       - Start on unasyn for prolonged SROM       - IUPC placed       - increase pitocin to 22mus       - Anticipate imminent delivery; ppH tray easily accessible and staff counseled

## 2017-06-13 NOTE — Progress Notes (Signed)
Patient ID: [name], female   DOB: 05-Nov-1985, 32 y.o.   MRN: [id number] Asked to step into room. On arrival head was out and RN was present. I slipped gloves on and the baby was delivered to mom's abdomen. Spontaneous cry heard. After 1 minute, cord clamped x 2 and cut by FOB. Dr. Mindi SlickerBanga arrived just following this.

## 2017-06-13 NOTE — Progress Notes (Addendum)
Informed patient she needs to be monitored at all times due to being on Pitocin. She previously walked out of range where I could not get a tracing from monitors. Patient was told not to walk far with monitors due to out of range disconnection.  Against my advice patient went outside with monitors. I was unable to trace either mom or baby. I called security and they informed me she had walked out. Notifed Charge Nurse Donna BernardKatie Foresell,RNC   the reason I was unable to trace mom and baby.

## 2017-06-14 LAB — CBC
HCT: 26.2 % — ABNORMAL LOW (ref 36.0–46.0)
Hemoglobin: 9 g/dL — ABNORMAL LOW (ref 12.0–15.0)
MCH: 30.9 pg (ref 26.0–34.0)
MCHC: 34.4 g/dL (ref 30.0–36.0)
MCV: 90 fL (ref 78.0–100.0)
PLATELETS: 415 10*3/uL — AB (ref 150–400)
RBC: 2.91 MIL/uL — ABNORMAL LOW (ref 3.87–5.11)
RDW: 13.6 % (ref 11.5–15.5)
WBC: 15.7 10*3/uL — AB (ref 4.0–10.5)

## 2017-06-14 LAB — RPR: RPR Ser Ql: NONREACTIVE

## 2017-06-14 LAB — BIRTH TISSUE RECOVERY COLLECTION (PLACENTA DONATION)

## 2017-06-14 NOTE — Lactation Note (Signed)
This note was copied from a baby's chart. Lactation Consultation Note  Patient Name: Girl Judie PetitDanielle Levier WUJWJ'XToday's Date: 06/14/2017 Reason for consult: Follow-up assessment Mom exclusively pumping and bottle feeding.  I asked her if she had any concerns and she said she was going to wait until she got home to pump.  Explained supply and demand.  Encouraged to pump every 2-3 hours to stimulate production.  Mom did not respond.  Maternal Data    Feeding    LATCH Score                   Interventions    Lactation Tools Discussed/Used     Consult Status Consult Status: PRN    Huston FoleyMOULDEN, Javonni Macke S 06/14/2017, 2:54 PM

## 2017-06-14 NOTE — Progress Notes (Signed)
Post Partum Day 1 Subjective: no complaints, up ad lib, voiding, tolerating PO and nl lochia, pain controlled  Objective: Blood pressure (!) 103/59, pulse 76, temperature 98.7 F (37.1 C), resp. rate 17, height 5\' 3"  (1.6 m), weight 69 kg (152 lb 1.9 oz), last menstrual period 09/16/2016, unknown if currently breastfeeding.  Physical Exam:  General: alert and no distress Lochia: appropriate Uterine Fundus: firm  Recent Labs    06/12/17 1936 06/14/17 0541  HGB 10.2* 9.0*  HCT 29.9* 26.2*    Assessment/Plan: Plan for discharge tomorrow, Breastfeeding and Lactation consult. Routine PP care   LOS: 2 days   Nemesis Rainwater Bovard-Stuckert 06/14/2017, 7:42 AM

## 2017-06-15 ENCOUNTER — Inpatient Hospital Stay (HOSPITAL_COMMUNITY)
Admission: RE | Admit: 2017-06-15 | Payer: Medicaid Other | Source: Ambulatory Visit | Admitting: Obstetrics and Gynecology

## 2017-06-15 MED ORDER — IBUPROFEN 600 MG PO TABS: 600.0000 mg | ORAL_TABLET | Freq: Four times a day (QID) | ORAL | 0 refills | Status: DC

## 2017-06-15 MED ORDER — OXYCODONE HCL 5 MG PO TABS: 5.0000 mg | ORAL_TABLET | ORAL | 0 refills | Status: DC | PRN

## 2017-06-15 NOTE — Progress Notes (Signed)
Post Partum Day 2 Subjective: up ad lib and tolerating PO  C/o continued back pain that is her chronic baseline back pain.  Objective: Blood pressure 112/70, pulse 71, temperature (!) 97.5 F (36.4 C), temperature source Oral, resp. rate 18, height 5\' 3"  (1.6 m), weight 152 lb 1.9 oz (69 kg), last menstrual period 09/16/2016, unknown if currently breastfeeding.  Physical Exam:  General: alert and cooperative Lochia: appropriate Uterine Fundus: firm   Recent Labs    06/12/17 1936 06/14/17 0541  HGB 10.2* 9.0*  HCT 29.9* 26.2*    Assessment/Plan: Discharge home  Pt will schedule a tubal ligation as an interval tubal in next 6-8 weeks  Has ibuprofen and sent in #15 oxycodone for her back pain    LOS: 3 days   Oliver PilaKathy W Emelly Wurtz 06/15/2017, 9:40 AM

## 2017-06-15 NOTE — Discharge Summary (Signed)
OB Discharge Summary     Patient Name: [name] DOB: 1985-05-30 MRN: [id number]  Date of admission: 06/12/2017 Delivering MD: Pryor Ochoa [facility]   Date of discharge: 06/15/2017  Admitting diagnosis: 38 WKS, LEAKING Intrauterine pregnancy: [redacted]w[redacted]d     Secondary diagnosis:  Active Problems:   PROM (premature rupture of membranes)   SVD (spontaneous vaginal delivery)   Postpartum care following vaginal delivery  Additional problems: none     Discharge diagnosis: Term Pregnancy Delivered                                                                                                Post partum procedures:none  Augmentation: Pitocin  Complications: None  Hospital course:  Onset of Labor With Vaginal Delivery     32 y.o. yo [id number] at [redacted]w[redacted]d was admitted in Latent Labor on 06/12/2017. Patient had an uncomplicated labor course as follows:  Membrane Rupture Time/Date: 12:30 PM ,06/12/2017   Intrapartum Procedures: Episiotomy: None [1]                                         Lacerations:  None [1]  Patient had a delivery of a Viable infant. 06/13/2017  Information for the patient's newborn:  Lashaunda, Schild Girl Zakirah [[id number]]  Delivery Method: Vag-Spont    Pateint had an uncomplicated postpartum course.  She is ambulating, tolerating a regular diet, passing flatus, and urinating well. Patient is discharged home in stable condition on 06/15/17.   Physical exam  Vitals:   06/14/17 0934 06/14/17 1800 06/15/17 0522 06/15/17 0556  BP: 105/72 (!) 97/56 112/70   Pulse: 80 82 71   Resp:  16 18   Temp: 98.3 F (36.8 C) 98.3 F (36.8 C)  (!) 97.5 F (36.4 C)  TempSrc: Oral Oral  Oral  Weight:      Height:       General: alert and cooperative Lochia: appropriate Uterine Fundus: firm  Labs: Lab Results  Component Value Date   WBC 15.7 (H) 06/14/2017   HGB 9.0 (L) 06/14/2017   HCT 26.2 (L) 06/14/2017   MCV 90.0 06/14/2017   PLT 415 (H) 06/14/2017   CMP Latest Ref Rng & Units  07/24/2016  Glucose 65 - 99 mg/dL 94  BUN 6 - 20 mg/dL 6  Creatinine 4.69 - 6.29 mg/dL 5.28  Sodium 413 - 244 mmol/L 141  Potassium 3.5 - 5.1 mmol/L 3.1(L)  Chloride 101 - 111 mmol/L 106  CO2 22 - 32 mmol/L 29  Calcium 8.9 - 10.3 mg/dL 9.1  Total Protein 6.5 - 8.1 g/dL -  Total Bilirubin 0.3 - 1.2 mg/dL -  Alkaline Phos 38 - 010 U/L -  AST 15 - 41 U/L -  ALT 14 - 54 U/L -    Discharge instruction: per After Visit Summary and "Baby and Me Booklet".  After visit meds:  Allergies as of 06/15/2017   No Known Allergies     Medication List    STOP taking these medications  metroNIDAZOLE 500 MG tablet Commonly known as:  FLAGYL   oxyCODONE-acetaminophen 5-325 MG tablet Commonly known as:  PERCOCET/ROXICET     TAKE these medications   ibuprofen 600 MG tablet Commonly known as:  ADVIL,MOTRIN Take 1 tablet (600 mg total) by mouth every 6 (six) hours.   multivitamin-prenatal 27-0.8 MG Tabs tablet Take 1 tablet by mouth daily at 12 noon.   oxyCODONE 5 MG immediate release tablet Commonly known as:  Oxy IR/ROXICODONE Take 1 tablet (5 mg total) by mouth every 4 (four) hours as needed (pain scale 4-7).   PROAIR HFA 108 (90 Base) MCG/ACT inhaler Generic drug:  albuterol Inhale 2 puffs into the lungs every 6 (six) hours as needed.   ranitidine 150 MG tablet Commonly known as:  ZANTAC Take 300 mg by mouth daily.       Diet: routine diet  Activity: Advance as tolerated. Pelvic rest for 6 weeks.   Outpatient follow up:6 weeks Follow up Appt:No future appointments. Follow up Visit:No Follow-up on file.  Postpartum contraception: Tubal Ligation planned as an interval tubal   Newborn Data: Live born female  Birth Weight: 6 lb 10 oz (3005 g) APGAR: 9, 9  Newborn Delivery   Birth date/time:  06/13/2017 11:21:00 Delivery type:  Vaginal, Spontaneous     Baby Feeding: Bottle and Breast Disposition:home with mother   06/15/2017 Oliver PilaKathy W Rc Amison, MD

## 2018-01-05 IMAGING — US US OB TRANSVAGINAL
1 series · 15 of 28 positions shown · non-contrast
Comparison: 05/17/2015

CLINICAL DATA: LMP was 04/21/2015.Gestational age by LMP is 5 weeks
3 days.EDC by LMP is 01/26/2016. Mild cramping and pain since [DATE]. Quantitative beta HCG on 05/19/2015 was 552.

EXAM:
TRANSVAGINAL OB ULTRASOUND
TECHNIQUE: Transvaginal ultrasound was performed for complete evaluation of the
gestation as well as the maternal uterus, adnexal regions, and
pelvic cul-de-sac.

[Series 1: us ob transvaginal · 30 acquisitions, 15 frames shown]
[im 1/30]
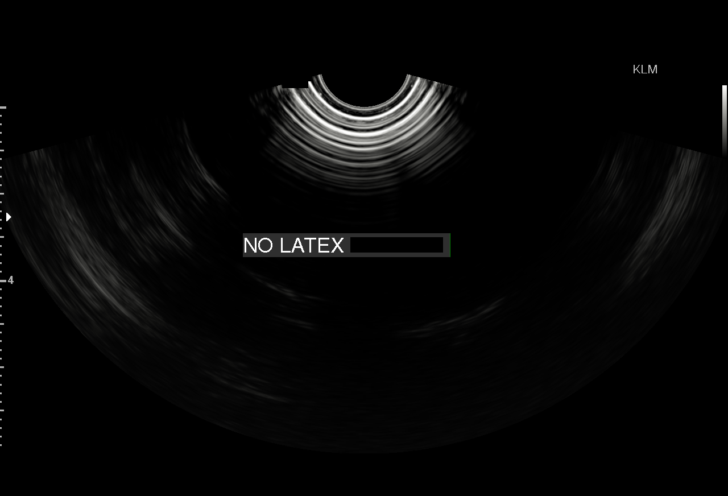
[im 3/30]
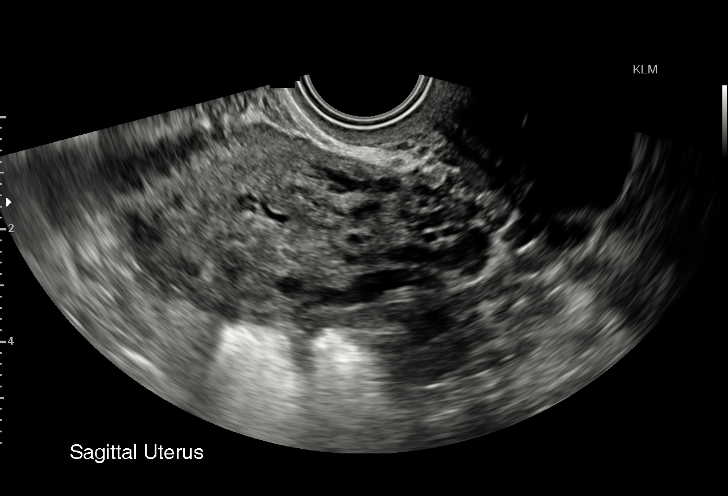
[im 5/30]
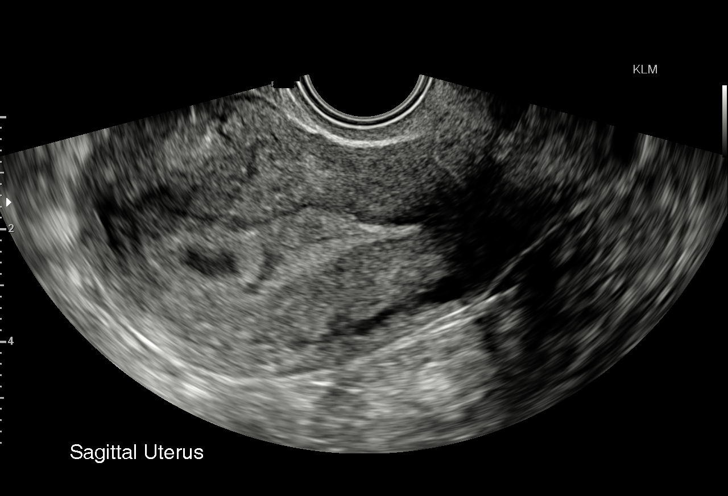
[im 7/30]
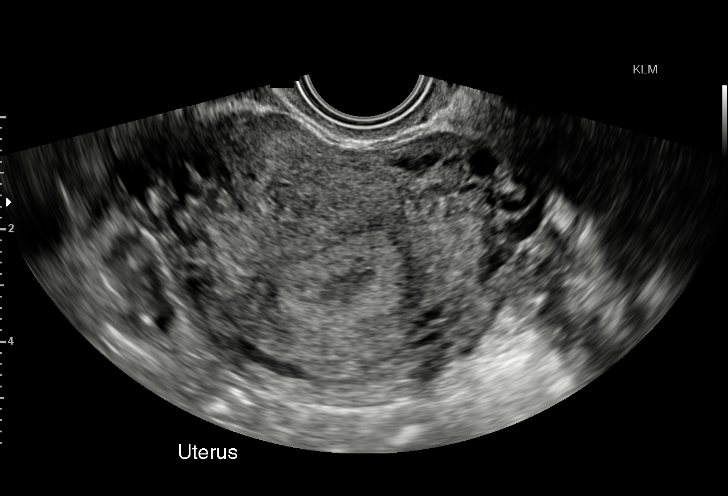
[im 9/30]
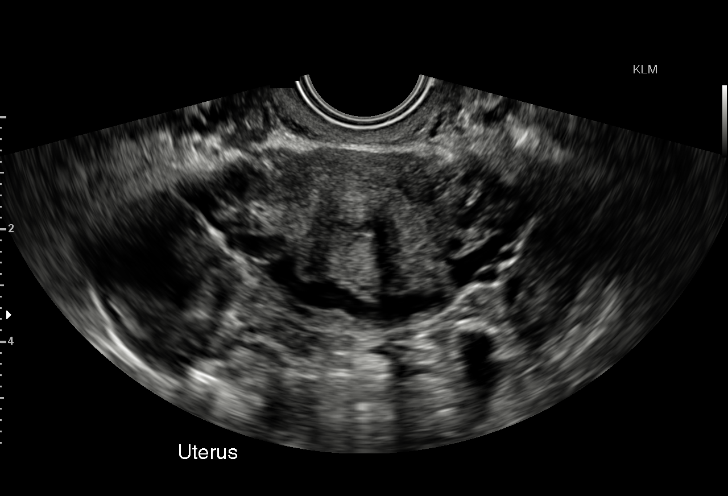
[im 11/30]
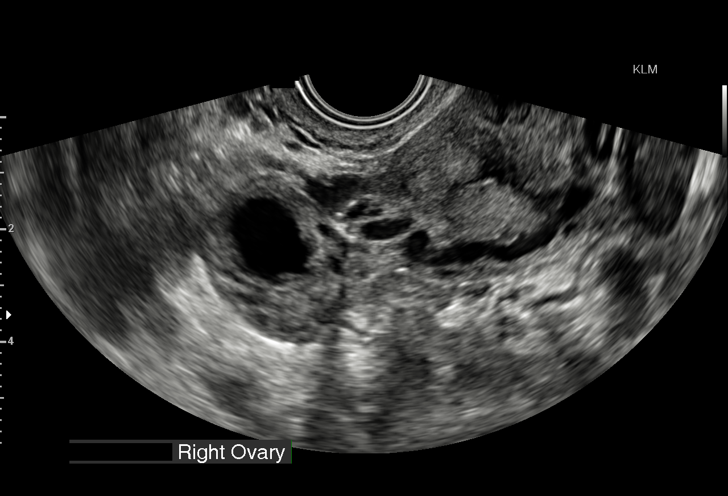
[im 13/30]
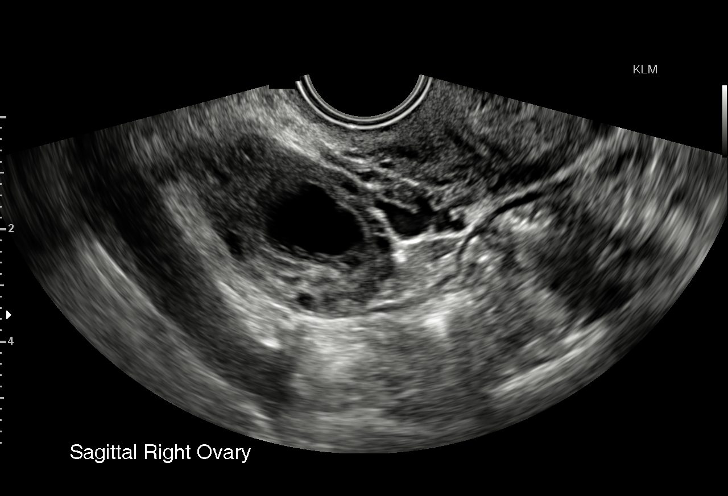
[im 16/30]
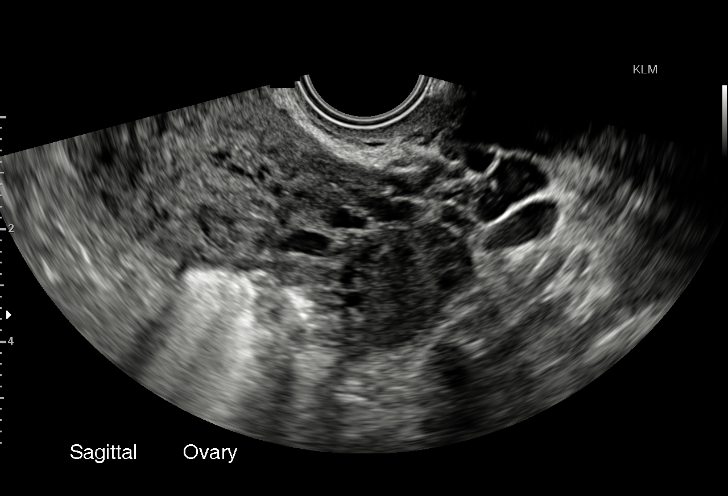
[im 17/30]
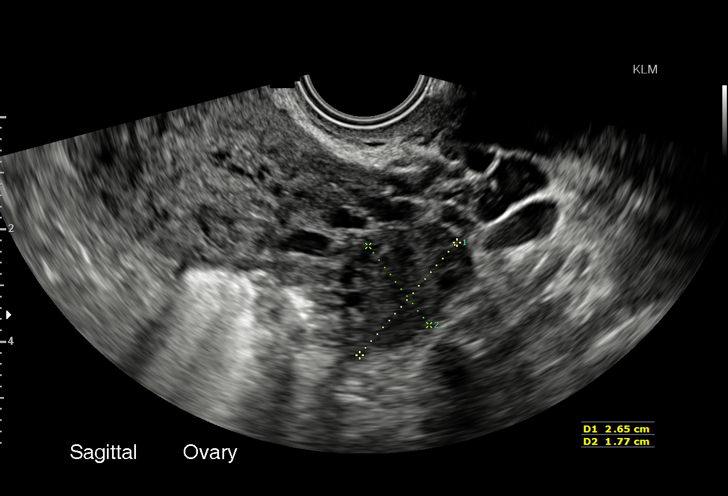
[im 19/30]
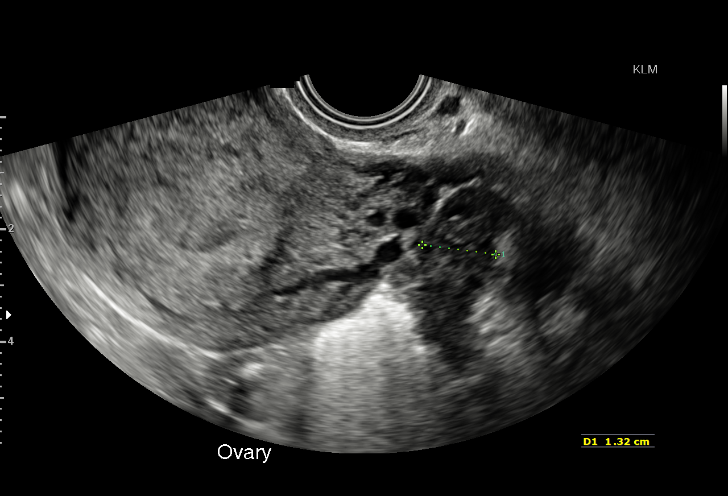
[im 21/30]
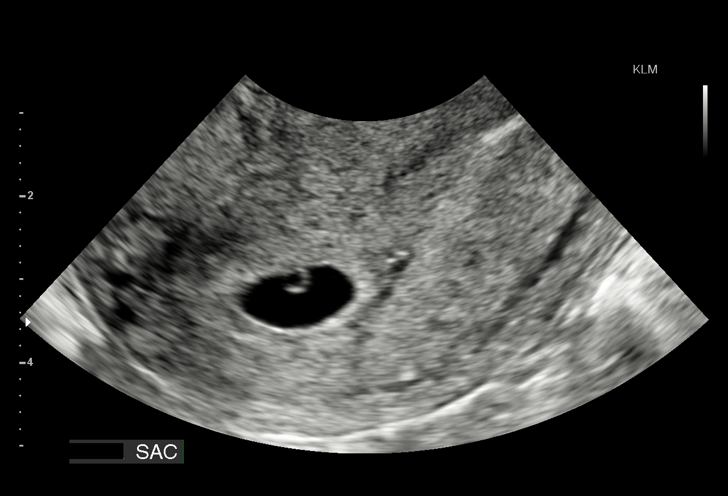
[im 23/30]
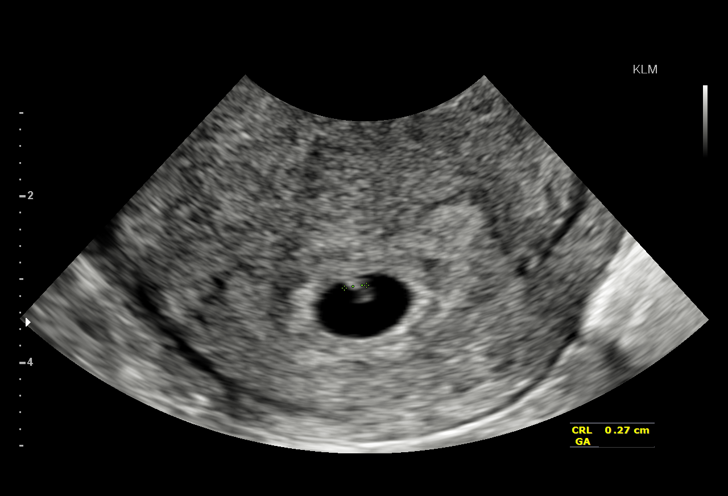
[im 25/30]
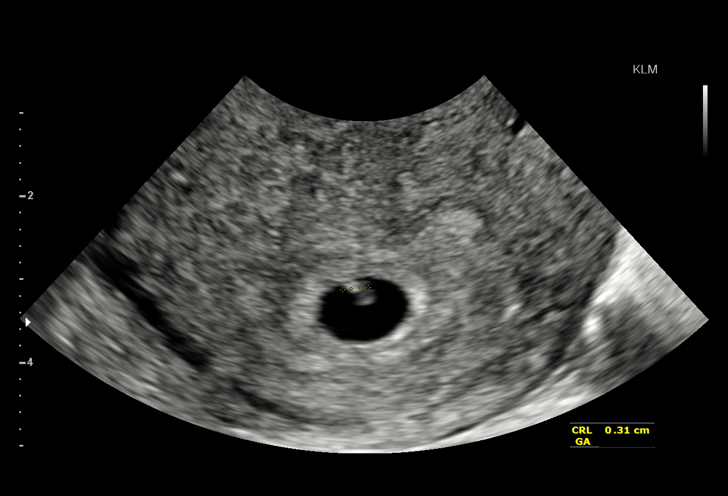
[im 27/30]
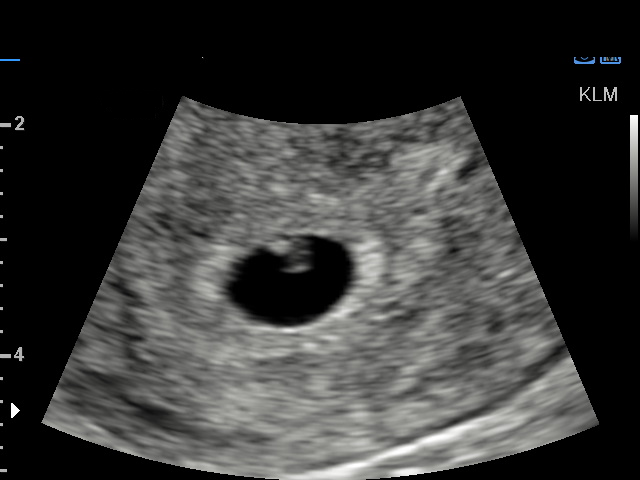
[im 30/30]
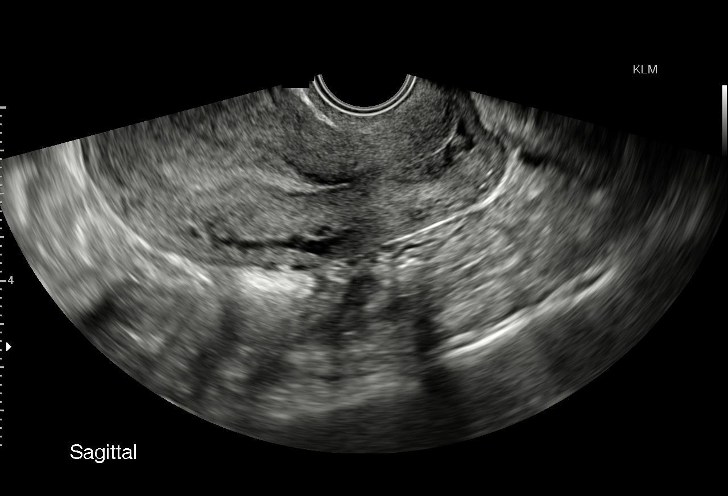

[15 of 28 positions shown; findings below may reference images not displayed]

FINDINGS: Intrauterine gestational sac: Visualized/normal in shape.

Yolk sac:  Present

Embryo:  Present

Cardiac Activity: Present

Heart Rate: 96 bpm

CRL:   3.0  mm   5 w 6 d                  US EDC: 01/23/2016

Subchorionic hemorrhage:  None visualized.

Maternal uterus/adnexae: Small corpus luteum cyst is identified in
the right ovary. Left ovary has normal appearance. No free pelvic
fluid.
IMPRESSION: 1. Single living intrauterine embryo.
2. Size and dates correlate well. Today's exam confirms clinical EDC
of 01/26/2016.
3. Heart rate less than 100 beats per minute warrants follow-up.
Followup ultrasound is suggested in 2 weeks.

## 2018-01-12 IMAGING — US US OB TRANSVAGINAL
1 series · 15 of 28 positions shown · non-contrast
Comparison: 05/29/2015

CLINICAL DATA: First trimester pregnancy with inconclusive fetal
viability. Embryonic bradycardia on prior exam. Lower abdominal
pain. Gestational age by LMP of 6 weeks 3 days.

EXAM:
TRANSVAGINAL OB ULTRASOUND
TECHNIQUE: Transvaginal ultrasound was performed for complete evaluation of the
gestation as well as the maternal uterus, adnexal regions, and
pelvic cul-de-sac.

[Series 1: us ob transvaginal · 15 of 32 slices shown]
[im 1/32]
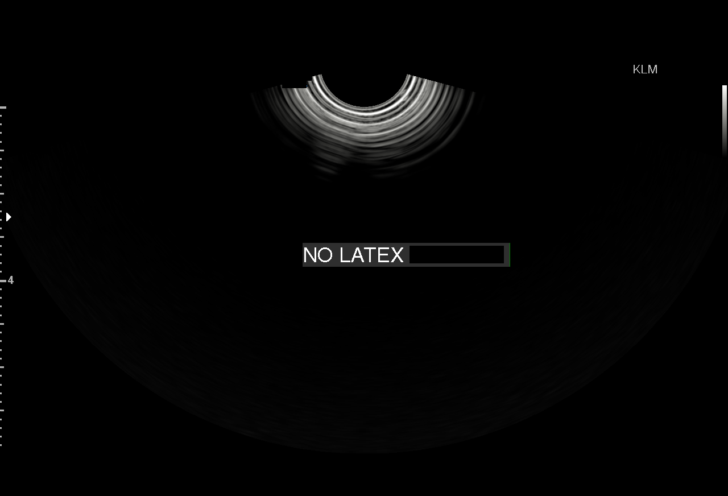
[im 3/32]
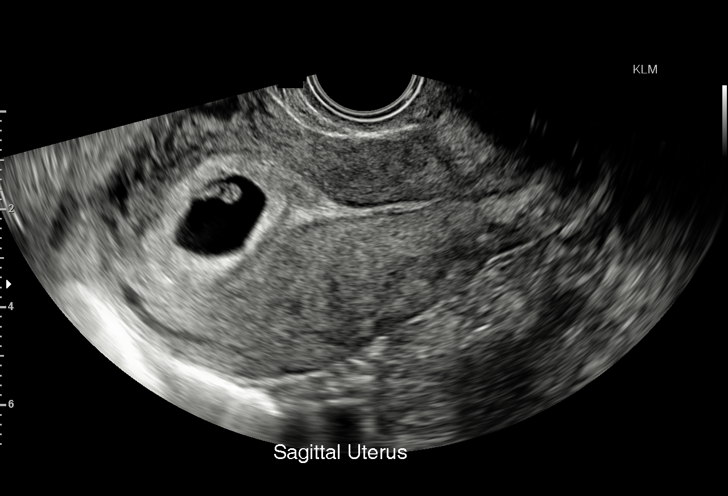
[im 5/32]
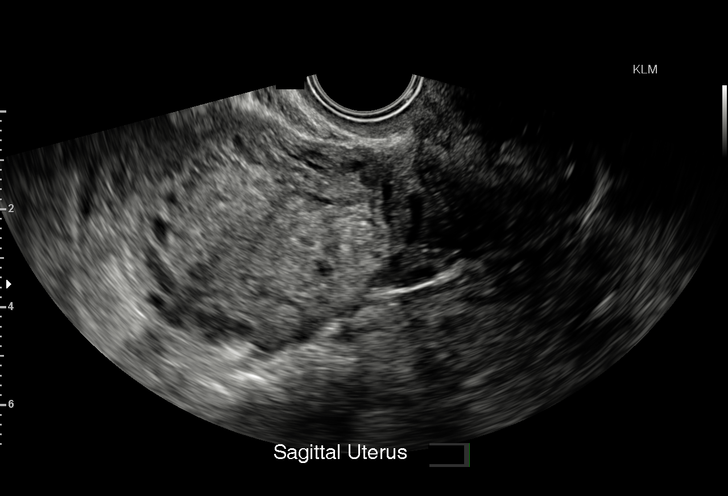
[im 7/32]
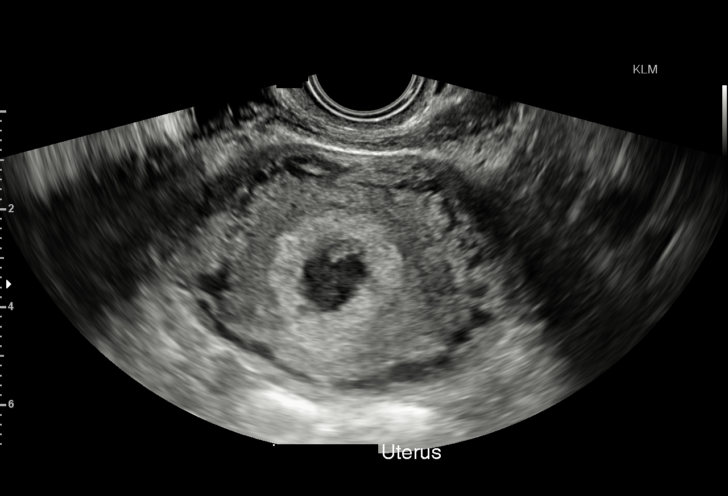
[im 10/32]
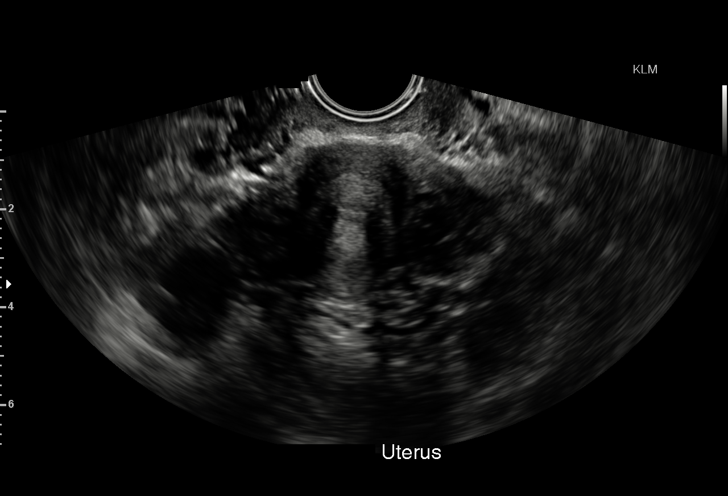
[im 12/32]
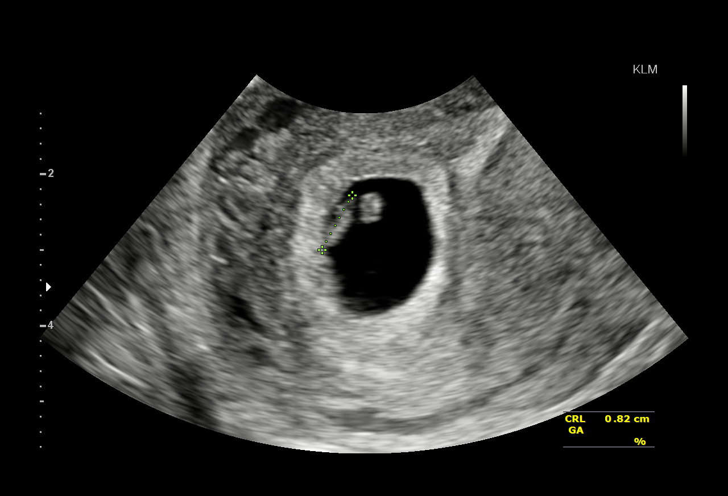
[im 14/32]
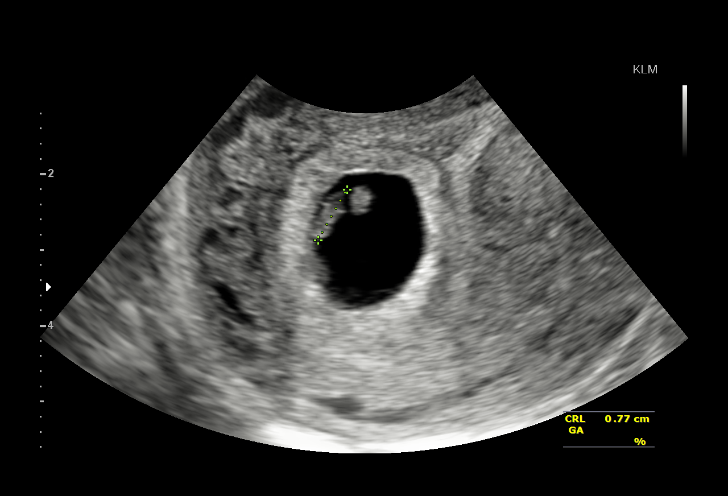
[im 17/32]
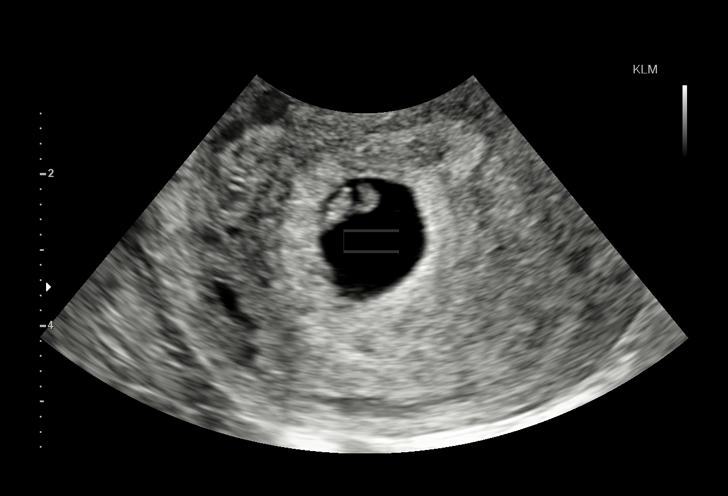
[im 18/32]
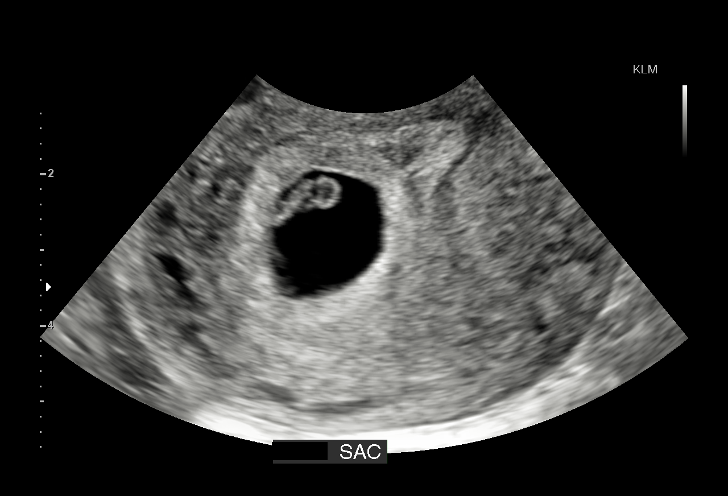
[im 20/32]
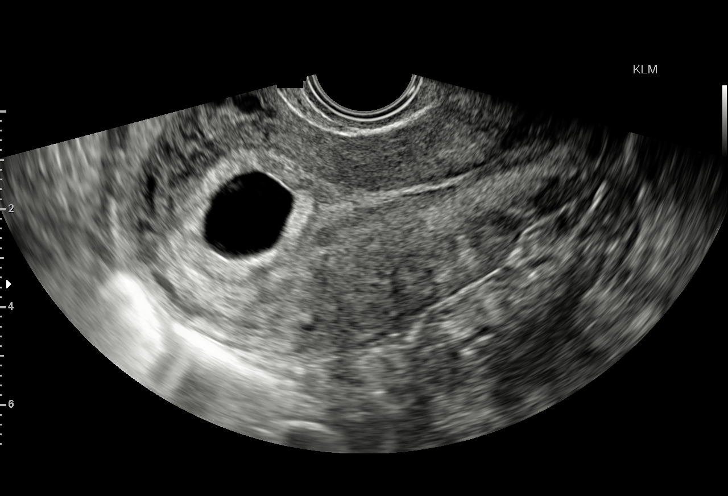
[im 22/32]
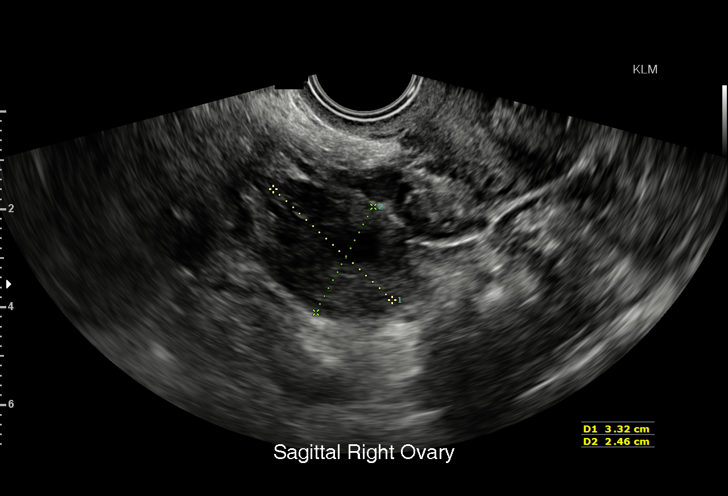
[im 25/32]
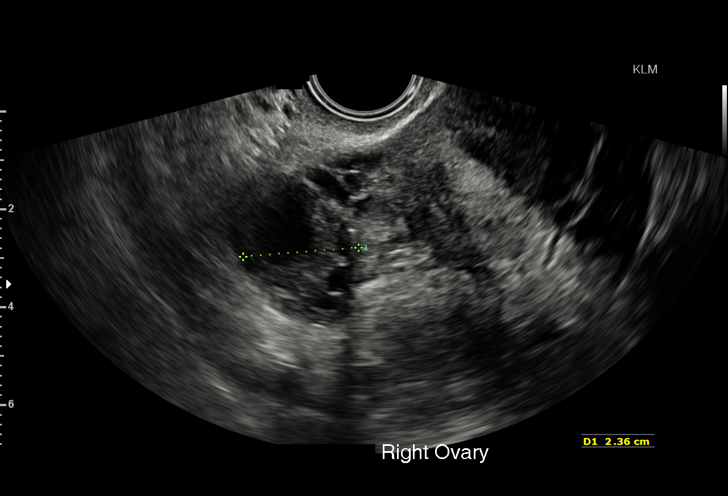
[im 27/32]
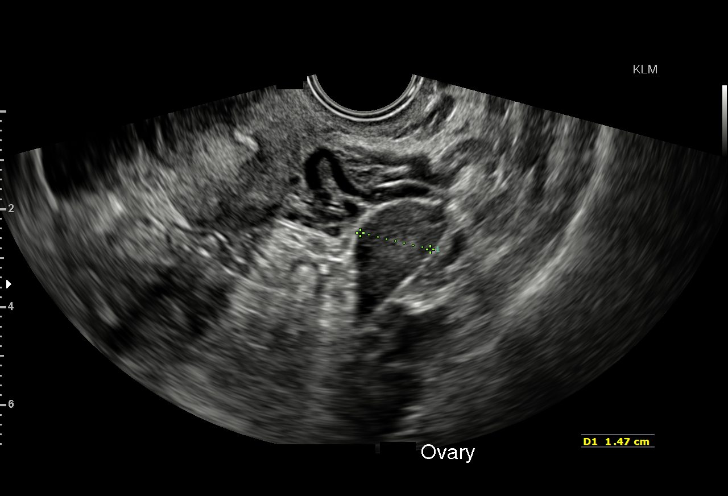
[im 29/32]
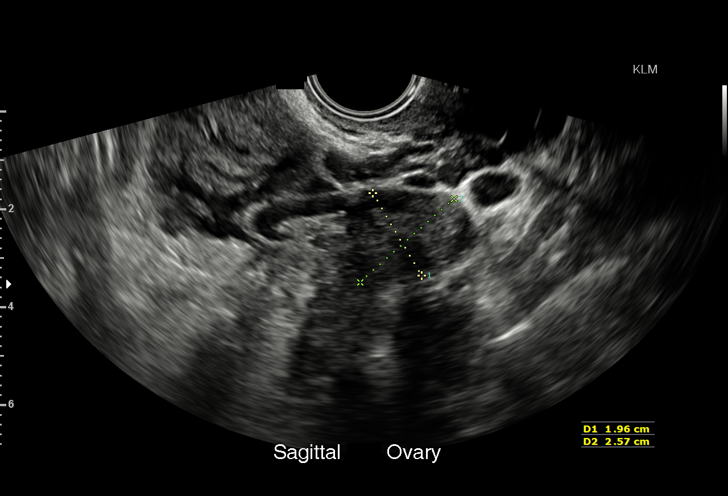
[im 32/32]
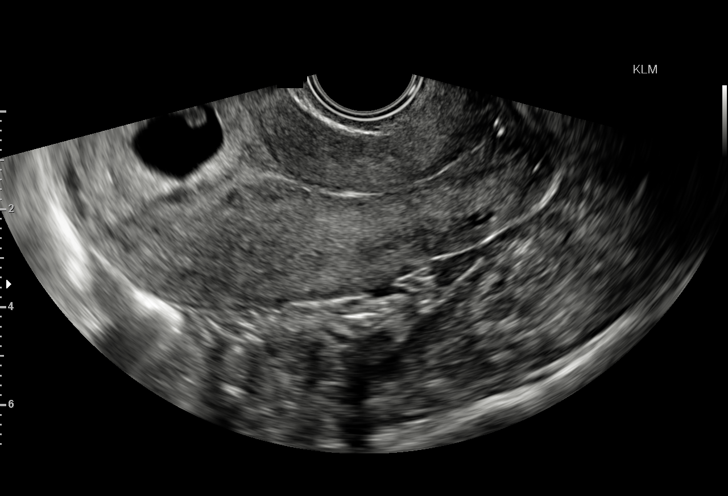

[15 of 28 positions shown; findings below may reference images not displayed]

FINDINGS: Intrauterine gestational sac: Visualized/normal in shape.

Yolk sac:  Visualized

Embryo:  Visualized

Cardiac Activity: Visualized

Heart Rate: 108 bpm

CRL:   8  mm   6 w 5 d                  US EDC: 01/24/2016

Subchorionic hemorrhage:  None visualized.

Maternal uterus/adnexae: Normal appearance of both ovaries. No mass
or free fluid identified.
IMPRESSION: Single living IUP with assigned gestational age of 6 weeks 3 days.
Appropriate interval growth.

No significant maternal uterine or adnexal abnormality identified.

## 2019-05-29 ENCOUNTER — Inpatient Hospital Stay (HOSPITAL_COMMUNITY)
Admission: AD | Admit: 2019-05-29 | Discharge: 2019-05-29 | Disposition: A | Discharge status: Home / Self Care | Payer: Medicaid Other | Attending: Obstetrics and Gynecology | Admitting: Obstetrics and Gynecology

## 2019-05-29 ENCOUNTER — Other Ambulatory Visit: Payer: Self-pay

## 2019-05-29 DIAGNOSIS — Z79899 Other long term (current) drug therapy: Secondary | ICD-10-CM | POA: Insufficient documentation

## 2019-05-29 DIAGNOSIS — Z3A00 Weeks of gestation of pregnancy not specified: Secondary | ICD-10-CM | POA: Insufficient documentation

## 2019-05-29 DIAGNOSIS — O26899 Other specified pregnancy related conditions, unspecified trimester: Secondary | ICD-10-CM

## 2019-05-29 DIAGNOSIS — O3680X0 Pregnancy with inconclusive fetal viability, not applicable or unspecified: Secondary | ICD-10-CM | POA: Diagnosis not present

## 2019-05-29 DIAGNOSIS — R109 Unspecified abdominal pain: Secondary | ICD-10-CM | POA: Insufficient documentation

## 2019-05-29 DIAGNOSIS — O99330 Smoking (tobacco) complicating pregnancy, unspecified trimester: Secondary | ICD-10-CM | POA: Insufficient documentation

## 2019-05-29 LAB — HEMOGLOBIN AND HEMATOCRIT, BLOOD
HCT: 42.4 % (ref 36.0–46.0)
Hemoglobin: 14.1 g/dL (ref 12.0–15.0)

## 2019-05-29 LAB — HCG, QUANTITATIVE, PREGNANCY: hCG, Beta Chain, Quant, S: 7 m[IU]/mL — ABNORMAL HIGH (ref <5)

## 2019-05-29 LAB — POCT PREGNANCY, URINE: Preg Test, Ur: NEGATIVE

## 2019-05-29 NOTE — MAU Note (Signed)
+  HPT Sat and Sun.  Called office on Monday.  Started spotting this morning, brownish. Little cramping.

## 2019-05-29 NOTE — Discharge Instructions (Signed)
Return to care  If you have heavier bleeding that soaks through more that 2 pads per hour for an hour or more If you bleed so much that you feel like you might pass out or you do pass out If you have significant abdominal pain that is not improved with Tylenol   

## 2019-05-29 NOTE — MAU Provider Note (Signed)
Chief Complaint: Vaginal Bleeding, Abdominal Pain, and Possible Pregnancy   First Provider Initiated Contact with Patient 05/29/19 1116     SUBJECTIVE HPI: [name] is a 34 y.o. [id number] at Unknown who presents to Maternity Admissions reporting brown spotting. Reports a positive pregnancy test over the weekend. Had her IUD removed last month with a bleeding episode shortly after. States she started having breast tenderness over the weekend so took HPTs.  Reports brown spotting this morning. Not having to wear a pad and not passing blood clots. Denies abdominal pain.    Past Medical History:  Diagnosis Date  . History of chicken pox   . History of chlamydia   . Ruptured intervertebral disc    S1   OB History  Gravida Para Term Preterm AB Living  7 6 6   1 6   SAB TAB Ectopic Multiple Live Births  1     0 6    # Outcome Date GA Lbr Len/2nd Weight Sex Delivery Anes PTL Lv  7 Term 06/13/17 [redacted]w[redacted]d 23:20 / 00:01 3005 g F Vag-Spont None  LIV     Birth Comments: WNL  6 Term 01/20/16 [redacted]w[redacted]d 03:22 / 00:07 2815 g M Vag-Spont EPI  LIV     Birth Comments: No anomalies noted  5 Term 04/21/11 [redacted]w[redacted]d 05:35 / 00:50 3265 g M Vag-Spont EPI  LIV     Birth Comments: none  4 SAB 2012          3 Term 2010 [redacted]w[redacted]d  3345 g M Vag-Spont EPI  LIV  2 Term 2008 [redacted]w[redacted]d  3260 g F Vag-Spont EPI  LIV  1 Term 2006 [redacted]w[redacted]d  3345 g M Vag-Spont EPI  LIV   Past Surgical History:  Procedure Laterality Date  . BREAST SURGERY     2 llumps remosved in 2007  . CHOLECYSTECTOMY  2010   Social History   Socioeconomic History  . Marital status: Single    Spouse name: Not on file  . Number of children: Not on file  . Years of education: Not on file  . Highest education level: Not on file  Occupational History  . Not on file  Tobacco Use  . Smoking status: Current Every Day Smoker    Packs/day: 1.00    Types: Cigarettes  . Smokeless tobacco: Never Used  Substance and Sexual Activity  . Alcohol use: No  . Drug  use: No  . Sexual activity: Yes  Other Topics Concern  . Not on file  Social History Narrative  . Not on file   Social Determinants of Health   Financial Resource Strain:   . Difficulty of Paying Living Expenses: Not on file  Food Insecurity:   . Worried About Charity fundraiser in the Last Year: Not on file  . Ran Out of Food in the Last Year: Not on file  Transportation Needs:   . Lack of Transportation (Medical): Not on file  . Lack of Transportation (Non-Medical): Not on file  Physical Activity:   . Days of Exercise per Week: Not on file  . Minutes of Exercise per Session: Not on file  Stress:   . Feeling of Stress : Not on file  Social Connections:   . Frequency of Communication with Friends and Family: Not on file  . Frequency of Social Gatherings with Friends and Family: Not on file  . Attends Religious Services: Not on file  . Active Member of Clubs or Organizations: Not on file  .  Attends Banker Meetings: Not on file  . Marital Status: Not on file  Intimate Partner Violence:   . Fear of Current or Ex-Partner: Not on file  . Emotionally Abused: Not on file  . Physically Abused: Not on file  . Sexually Abused: Not on file   Family History  Problem Relation Age of Onset  . Diabetes Maternal Grandmother   . Hypertension Maternal Grandmother   . Diabetes Paternal Grandmother   . Hypertension Paternal Grandmother   . Diabetes Father   . Cancer Paternal Aunt   . Cancer Mother        cervical cancer   No current facility-administered medications on file prior to encounter.   Current Outpatient Medications on File Prior to Encounter  Medication Sig Dispense Refill  . Prenatal Vit-Fe Fumarate-FA (MULTIVITAMIN-PRENATAL) 27-0.8 MG TABS tablet Take 1 tablet by mouth daily at 12 noon.    Marland Kitchen PROAIR HFA 108 (90 Base) MCG/ACT inhaler Inhale 2 puffs into the lungs every 6 (six) hours as needed.  0  . ranitidine (ZANTAC) 150 MG tablet Take 300 mg by mouth  daily.     No Known Allergies  I have reviewed patient's Past Medical Hx, Surgical Hx, Family Hx, Social Hx, medications and allergies.   Review of Systems  Constitutional: Negative.   Gastrointestinal: Negative.   Genitourinary: Positive for vaginal discharge.    OBJECTIVE Patient Vitals for the past 24 hrs:  BP Temp Temp src Pulse Resp SpO2 Height Weight  05/29/19 1100 113/79 98.2 F (36.8 C) Oral 76 18 100 % 5\' 3"  (1.6 m) 67.1 kg   Constitutional: Well-developed, well-nourished female in no acute distress.  Respiratory: normal rate and effort. MS: Extremities nontender, no edema, normal ROM Neurologic: Alert and oriented x 4.    LAB RESULTS Results for orders placed or performed during the hospital encounter of 05/29/19 (from the past 24 hour(s))  Pregnancy, urine POC     Status: None   Collection Time: 05/29/19 11:09 AM  Result Value Ref Range   Preg Test, Ur NEGATIVE NEGATIVE  Hemoglobin and hematocrit, blood     Status: None   Collection Time: 05/29/19 11:26 AM  Result Value Ref Range   Hemoglobin 14.1 12.0 - 15.0 g/dL   HCT 05/31/19 10.1 - 75.1 %  hCG, quantitative, pregnancy     Status: Abnormal   Collection Time: 05/29/19 11:26 AM  Result Value Ref Range   hCG, Beta Chain, Quant, S 7 (H) <5 mIU/mL    IMAGING No results found.  MAU COURSE Orders Placed This Encounter  Procedures  . Hemoglobin and hematocrit, blood  . hCG, quantitative, pregnancy  . Pregnancy, urine POC  . Discharge patient   No orders of the defined types were placed in this encounter.   MDM UPT negative HCG 7  Reviewed with patient that this is most likely a miscarriage. Will have her f/u in the office later this week to confirm that HCG has dropped to negative.   RH positive  ASSESSMENT 1. Pregnancy of unknown anatomic location   2. Abdominal pain affecting pregnancy, antepartum     PLAN Discharge home in stable condition. Bleeding & ectopic precautions Left message with  [facility] for patient to have follow up labs later this week. Patient instructed to call the office if she doesn't hear from them.   Follow-up Information    [facility]. Schedule an appointment as soon as possible for a visit.   Contact information: [address]  [address] [place] [address] [phone]          Allergies as of 05/29/2019   No Known Allergies     Medication List    STOP taking these medications   ibuprofen 600 MG tablet Commonly known as: ADVIL   oxyCODONE 5 MG immediate release tablet Commonly known as: Oxy IR/ROXICODONE     TAKE these medications   multivitamin-prenatal 27-0.8 MG Tabs tablet Take 1 tablet by mouth daily at 12 noon.   ProAir HFA 108 (90 Base) MCG/ACT inhaler Generic drug: albuterol Inhale 2 puffs into the lungs every 6 (six) hours as needed.   ranitidine 150 MG tablet Commonly known as: ZANTAC Take 300 mg by mouth daily.        Judeth Horn, NP 05/29/2019  12:56 PM

## 2020-01-17 ENCOUNTER — Other Ambulatory Visit: Payer: Self-pay | Admitting: Sports Medicine

## 2020-01-17 DIAGNOSIS — M545 Low back pain, unspecified: Secondary | ICD-10-CM

## 2020-02-07 ENCOUNTER — Ambulatory Visit
Admission: RE | Admit: 2020-02-07 | Discharge: 2020-02-07 | Disposition: A | Discharge status: Home / Self Care | Payer: Medicaid Other | Source: Ambulatory Visit | Attending: Sports Medicine | Admitting: Sports Medicine

## 2020-02-07 DIAGNOSIS — M545 Low back pain, unspecified: Secondary | ICD-10-CM

## 2020-04-12 NOTE — L&D Delivery Note (Signed)
Delivery Note At 7:49 PM a viable and healthy female was delivered via Vaginal, Spontaneous (Presentation: Right Occiput Anterior).  APGAR: 8, 9; weight pending  .   Placenta status: Spontaneous, Intact.  Cord: 3 vessels with double nuchal cord reduced on the perineum  The patient pushed 3 times with one contraction and delivered a vigorous female infant in the vertex ROA presentation. A double nuchal cord was reduced on the perineum and the infant was passed to the waiting maternal abdomen. After a 1 minute delay, the cord was clamed and cut. The placenta was delivered spontaneously, intact, with 3VC. No lacerations required repair.  Anesthesia: Epidural Episiotomy: None Lacerations: None Suture Repair:  NA Est. Blood Loss (mL): 87  Mom to postpartum.  Baby to Couplet care / Skin to Skin.  Waynard Reeds 04/04/2021, 8:00 PM

## 2020-07-07 ENCOUNTER — Encounter (HOSPITAL_COMMUNITY): Payer: Self-pay | Admitting: Obstetrics and Gynecology

## 2020-07-07 ENCOUNTER — Inpatient Hospital Stay (HOSPITAL_COMMUNITY)
Admission: AD | Admit: 2020-07-07 | Discharge: 2020-07-07 | Disposition: A | Discharge status: Home / Self Care | Payer: Medicaid Other | Attending: Obstetrics and Gynecology | Admitting: Obstetrics and Gynecology

## 2020-07-07 ENCOUNTER — Other Ambulatory Visit: Payer: Self-pay

## 2020-07-07 DIAGNOSIS — Z3202 Encounter for pregnancy test, result negative: Secondary | ICD-10-CM

## 2020-07-07 DIAGNOSIS — N939 Abnormal uterine and vaginal bleeding, unspecified: Secondary | ICD-10-CM | POA: Diagnosis present

## 2020-07-07 LAB — POCT PREGNANCY, URINE: Preg Test, Ur: NEGATIVE

## 2020-07-07 LAB — HCG, QUANTITATIVE, PREGNANCY: hCG, Beta Chain, Quant, S: 5 m[IU]/mL — ABNORMAL HIGH (ref <5)

## 2020-07-07 NOTE — MAU Provider Note (Signed)
Event Date/Time   First Provider Initiated Contact with Patient 07/07/20 1604      S Ms. [name] is a 35 y.o. [id number] patient who was sent to MAU today from [facility] with complaint of (+) HPT, spotting that got heavier and now (-) HPT. She reports she had (+) HPTs Wednesday, Thursday, Friday and Saturday morning. She reports dark, brown spotting Friday and Saturday that became bright, red later Saturday. She took another HPT Saturday night that was (-). She also reports abdominal cramping; that she rates 9/10. She called her GYN office and they told her to come to MAU, if she was having heavy VB with a (+) HPT; despite her also having a (-) HPT.  O BP 136/78 (BP Location: Right Arm)   Pulse 80   Temp 98.2 F (36.8 C) (Oral)   Resp 16   Ht 5\' 3"  (1.6 m)   Wt 69.4 kg   LMP 05/22/2020   SpO2 98% Comment: room air  BMI 27.09 kg/m  Physical Exam Vitals and nursing note reviewed.  Constitutional:      Appearance: Normal appearance. She is normal weight.  Genitourinary:    Comments: Not indicated Musculoskeletal:        General: Normal range of motion.  Neurological:     Mental Status: She is alert and oriented to person, place, and time.  Psychiatric:        Mood and Affect: Mood normal.        Behavior: Behavior normal.        Thought Content: Thought content normal.        Judgment: Judgment normal.    A Medical screening exam complete Negative pregnancy test - (-) UPT & HCG=5 Episode of heavy vaginal bleeding    P Discharge from MAU in stable condition Patient given the option to seek care at [facility] this week. Patient states  Warning signs for worsening condition that would warrant emergency follow-up discussed Patient may return to MAU for pregnancy concerns as needed   [facility], CNM 07/07/2020 4:06 PM

## 2020-07-07 NOTE — MAU Note (Signed)
[name] is a 35 y.o.  here in MAU reporting: started spotting on Friday and it was dark brown. On Saturday it was still only when she wiped. Later on Saturday bleeding got heavier and was more bright red. Had previously had + UPT and then Saturday night UPT was negative. Having cramping.   LMP: 05/22/20  Onset of complaint: ongoing  Pain score: 9/10  Vitals:   07/07/20 1341  BP: 136/78  Pulse: 80  Resp: 16  Temp: 98.2 F (36.8 C)  SpO2: 98%     Lab orders placed from triage: UPT

## 2020-08-05 ENCOUNTER — Inpatient Hospital Stay (HOSPITAL_COMMUNITY)
Admission: AD | Admit: 2020-08-05 | Discharge: 2020-08-06 | Disposition: A | Discharge status: Home / Self Care | Payer: Medicaid Other | Attending: Obstetrics and Gynecology | Admitting: Obstetrics and Gynecology

## 2020-08-05 ENCOUNTER — Other Ambulatory Visit: Payer: Self-pay

## 2020-08-05 DIAGNOSIS — O26899 Other specified pregnancy related conditions, unspecified trimester: Secondary | ICD-10-CM | POA: Diagnosis not present

## 2020-08-05 DIAGNOSIS — O3680X0 Pregnancy with inconclusive fetal viability, not applicable or unspecified: Secondary | ICD-10-CM | POA: Insufficient documentation

## 2020-08-05 DIAGNOSIS — R102 Pelvic and perineal pain: Secondary | ICD-10-CM | POA: Diagnosis not present

## 2020-08-05 DIAGNOSIS — Z3A01 Less than 8 weeks gestation of pregnancy: Secondary | ICD-10-CM | POA: Diagnosis not present

## 2020-08-05 DIAGNOSIS — O26891 Other specified pregnancy related conditions, first trimester: Secondary | ICD-10-CM | POA: Insufficient documentation

## 2020-08-05 DIAGNOSIS — F17210 Nicotine dependence, cigarettes, uncomplicated: Secondary | ICD-10-CM | POA: Insufficient documentation

## 2020-08-05 DIAGNOSIS — Z79899 Other long term (current) drug therapy: Secondary | ICD-10-CM | POA: Insufficient documentation

## 2020-08-05 DIAGNOSIS — O99330 Smoking (tobacco) complicating pregnancy, unspecified trimester: Secondary | ICD-10-CM | POA: Insufficient documentation

## 2020-08-05 LAB — POCT PREGNANCY, URINE: Preg Test, Ur: POSITIVE — AB

## 2020-08-05 NOTE — MAU Note (Signed)
Patient had a positive at home pregnancy test on Thursday July 31, 2020. Patient reports she had a miscarriage at the end of March and has not had a period since then. Reports sharp lower abdominal pain and began spotting yesterday but it went away.   Pain: 9/10

## 2020-08-06 ENCOUNTER — Inpatient Hospital Stay (HOSPITAL_COMMUNITY): Payer: Medicaid Other

## 2020-08-06 ENCOUNTER — Encounter (HOSPITAL_COMMUNITY): Payer: Self-pay | Admitting: Obstetrics and Gynecology

## 2020-08-06 DIAGNOSIS — O26899 Other specified pregnancy related conditions, unspecified trimester: Secondary | ICD-10-CM | POA: Diagnosis not present

## 2020-08-06 DIAGNOSIS — O3680X0 Pregnancy with inconclusive fetal viability, not applicable or unspecified: Secondary | ICD-10-CM

## 2020-08-06 DIAGNOSIS — R102 Pelvic and perineal pain: Secondary | ICD-10-CM

## 2020-08-06 LAB — WET PREP, GENITAL
Clue Cells Wet Prep HPF POC: NONE SEEN
Sperm: NONE SEEN
Trich, Wet Prep: NONE SEEN
Yeast Wet Prep HPF POC: NONE SEEN

## 2020-08-06 LAB — BASIC METABOLIC PANEL
Anion gap: 9 (ref 5–15)
BUN: 6 mg/dL (ref 6–20)
CO2: 22 mmol/L (ref 22–32)
Calcium: 9.1 mg/dL (ref 8.9–10.3)
Chloride: 105 mmol/L (ref 98–111)
Creatinine, Ser: 0.71 mg/dL (ref 0.44–1.00)
GFR, Estimated: >60 mL/min (ref >60)
Glucose, Bld: 94 mg/dL (ref 70–99)
Potassium: 3.2 mmol/L — ABNORMAL LOW (ref 3.5–5.1)
Sodium: 136 mmol/L (ref 135–145)

## 2020-08-06 LAB — GC/CHLAMYDIA PROBE AMP (~~LOC~~) NOT AT ARMC
Chlamydia: NEGATIVE
Comment: NEGATIVE
Comment: NORMAL
Neisseria Gonorrhea: NEGATIVE

## 2020-08-06 LAB — CBC
HCT: 36.4 % (ref 36.0–46.0)
Hemoglobin: 12.7 g/dL (ref 12.0–15.0)
MCH: 32.2 pg (ref 26.0–34.0)
MCHC: 34.9 g/dL (ref 30.0–36.0)
MCV: 92.2 fL (ref 80.0–100.0)
Platelets: 291 10*3/uL (ref 150–400)
RBC: 3.95 MIL/uL (ref 3.87–5.11)
RDW: 12.9 % (ref 11.5–15.5)
WBC: 8.6 10*3/uL (ref 4.0–10.5)
nRBC: 0 % (ref 0.0–0.2)

## 2020-08-06 LAB — HCG, QUANTITATIVE, PREGNANCY: hCG, Beta Chain, Quant, S: 1778 m[IU]/mL — ABNORMAL HIGH (ref <5)

## 2020-08-06 MED ORDER — GUAIFENESIN ER 600 MG PO TB12
600.0000 mg | ORAL_TABLET | Freq: Once | ORAL | Status: DC
  Filled 2020-08-06: qty 1

## 2020-08-06 NOTE — MAU Provider Note (Signed)
Chief Complaint: Abdominal Pain   Event Date/Time   First Provider Initiated Contact with Patient 08/06/20 0005        SUBJECTIVE HPI: [name] is a 35 y.o. [id number] at Unknown by LMP who presents to maternity admissions reporting sharp pelvic pain since early today. Had one episode of spotting after IC yesterday, not recurrent.  Pain is center and a little to left.  Treated for UTI in March, thinks the miscarriage then was caused by the antibiotic.Marland KitchenAlso had a possible miscarriage in February.  She denies vaginal bleeding, vaginal itching/burning, urinary symptoms, h/a, dizziness, n/v, or fever/chills.    Abdominal Pain This is a new problem. The current episode started yesterday. The problem occurs constantly. The problem has been unchanged. The pain is located in the suprapubic region and LLQ. The quality of the pain is cramping. The abdominal pain does not radiate. Pertinent negatives include no dysuria, fever, myalgias, nausea or vomiting. The pain is aggravated by palpation. The pain is relieved by nothing. She has tried nothing for the symptoms.   RN Note: Patient had a positive at home pregnancy test on Thursday July 31, 2020. Patient reports she had a miscarriage at the end of March and has not had a period since then. Reports sharp lower abdominal pain and began spotting yesterday but it went away.   Pain: 9/10  Past Medical History:  Diagnosis Date  . History of chicken pox   . History of chlamydia   . Ruptured intervertebral disc    S1   Past Surgical History:  Procedure Laterality Date  . BREAST SURGERY     2 llumps remosved in 2007  . CHOLECYSTECTOMY  2010   Social History   Socioeconomic History  . Marital status: Single    Spouse name: Not on file  . Number of children: Not on file  . Years of education: Not on file  . Highest education level: Not on file  Occupational History  . Not on file  Tobacco Use  . Smoking status: Current Every Day Smoker     Packs/day: 1.00    Types: Cigarettes  . Smokeless tobacco: Never Used  Substance and Sexual Activity  . Alcohol use: No  . Drug use: No  . Sexual activity: Yes  Other Topics Concern  . Not on file  Social History Narrative  . Not on file   Social Determinants of Health   Financial Resource Strain: Not on file  Food Insecurity: Not on file  Transportation Needs: Not on file  Physical Activity: Not on file  Stress: Not on file  Social Connections: Not on file  Intimate Partner Violence: Not on file   No current facility-administered medications on file prior to encounter.   Current Outpatient Medications on File Prior to Encounter  Medication Sig Dispense Refill  . Prenatal Vit-Fe Fumarate-FA (MULTIVITAMIN-PRENATAL) 27-0.8 MG TABS tablet Take 1 tablet by mouth daily at 12 noon.    Marland Kitchen PROAIR HFA 108 (90 Base) MCG/ACT inhaler Inhale 2 puffs into the lungs every 6 (six) hours as needed.  0  . ranitidine (ZANTAC) 150 MG tablet Take 300 mg by mouth daily.     No Known Allergies  I have reviewed patient's Past Medical Hx, Surgical Hx, Family Hx, Social Hx, medications and allergies.   ROS:  Review of Systems  Constitutional: Negative for fever.  Gastrointestinal: Positive for abdominal pain. Negative for nausea and vomiting.  Genitourinary: Negative for dysuria.  Musculoskeletal: Negative for myalgias.  Review of Systems  Other systems negative   Physical Exam  Physical Exam Patient Vitals for the past 24 hrs:  BP Temp Temp src Pulse Resp SpO2 Height Weight  08/05/20 2344 119/74 98.3 F (36.8 C) Oral 80 15 99 % 5\' 3"  (1.6 m) 67.2 kg   Constitutional: Well-developed, well-nourished female in no acute distress.  Cardiovascular: normal rate Respiratory: normal effort GI: Abd soft, non-tender. Pos BS x 4 MS: Extremities nontender, no edema, normal ROM Neurologic: Alert and oriented x 4.  GU: Neg CVAT.  PELVIC EXAM: Cervix pink, visually closed, without lesion, scant  white creamy discharge, vaginal walls and external genitalia normal Bimanual exam: Cervix 0/long/high, firm, anterior, neg CMT, uterus mildly tender, nonenlarged, adnexa without tenderness, enlargement, or mass on right, somewhat tender just to left of uterus   LAB RESULTS Results for orders placed or performed during the hospital encounter of 08/05/20 (from the past 24 hour(s))  Pregnancy, urine POC     Status: Abnormal   Collection Time: 08/05/20 11:41 PM  Result Value Ref Range   Preg Test, Ur POSITIVE (A) NEGATIVE  CBC     Status: None   Collection Time: 08/06/20 12:13 AM  Result Value Ref Range   WBC 8.6 4.0 - 10.5 K/uL   RBC 3.95 3.87 - 5.11 MIL/uL   Hemoglobin 12.7 12.0 - 15.0 g/dL   HCT 08/08/20 93.7 - 90.2 %   MCV 92.2 80.0 - 100.0 fL   MCH 32.2 26.0 - 34.0 pg   MCHC 34.9 30.0 - 36.0 g/dL   RDW 40.9 73.5 - 32.9 %   Platelets 291 150 - 400 K/uL   nRBC 0.0 0.0 - 0.2 %  hCG, quantitative, pregnancy     Status: Abnormal   Collection Time: 08/06/20 12:13 AM  Result Value Ref Range   hCG, Beta Chain, Quant, S 1,778 (H) <5 mIU/mL  Basic metabolic panel     Status: Abnormal   Collection Time: 08/06/20 12:13 AM  Result Value Ref Range   Sodium 136 135 - 145 mmol/L   Potassium 3.2 (L) 3.5 - 5.1 mmol/L   Chloride 105 98 - 111 mmol/L   CO2 22 22 - 32 mmol/L   Glucose, Bld 94 70 - 99 mg/dL   BUN 6 6 - 20 mg/dL   Creatinine, Ser 08/08/20 0.44 - 1.00 mg/dL   Calcium 9.1 8.9 - 2.68 mg/dL   GFR, Estimated 34.1 >96 mL/min   Anion gap 9 5 - 15  Wet prep, genital     Status: Abnormal   Collection Time: 08/06/20 12:27 AM   Specimen: Vaginal  Result Value Ref Range   Yeast Wet Prep HPF POC NONE SEEN NONE SEEN   Trich, Wet Prep NONE SEEN NONE SEEN   Clue Cells Wet Prep HPF POC NONE SEEN NONE SEEN   WBC, Wet Prep HPF POC FEW (A) NONE SEEN   Sperm NONE SEEN      IMAGING 08/08/20 OB Comp Less 14 Wks  Result Date: 08/06/2020 CLINICAL DATA:  Initial evaluation for acute lower abdominal pain,  recent miscarriage in March. EXAM: OBSTETRIC <14 WK April AND TRANSVAGINAL OB US TECHNIQUE: Both transabdominal and transvaginal ultrasound examinations were performed for complete evaluation of the gestation as well as the maternal uterus, adnexal regions, and pelvic cul-de-sac. Transvaginal technique was performed to assess early pregnancy. COMPARISON:  None available. FINDINGS: Intrauterine gestational sac: Negative. Yolk sac:  Negative. Embryo:  Negative. Cardiac Activity: Negative. Subchorionic hemorrhage:  None visualized. Maternal  uterus/adnexae: Ovaries within normal limits bilaterally. Degenerating corpus luteal cyst noted within the left ovary. No adnexal mass or free fluid. Uterus is anteverted. Endometrial stripe measures approximately 8 mm in thickness with internal microcystic change. Small amount of simple fluid noted within the endometrial cavity. IMPRESSION: 1. Early pregnancy with no discrete IUP or adnexal mass identified. Finding is consistent with a pregnancy of unknown anatomic location. Differential considerations include IUP to early to visualize, recent SAB, or possibly occult ectopic pregnancy. Close clinical monitoring with serial beta HCGs and close interval follow-up ultrasound recommended as clinically warranted. 2. No other acute maternal uterine or adnexal abnormality. Electronically Signed   By: Rise Mu M.D.   On: 08/06/2020 01:04   US OB Transvaginal  Result Date: 08/06/2020 CLINICAL DATA:  Initial evaluation for acute lower abdominal pain, recent miscarriage in March. EXAM: OBSTETRIC <14 WK [place] AND TRANSVAGINAL OB US TECHNIQUE: Both transabdominal and transvaginal ultrasound examinations were performed for complete evaluation of the gestation as well as the maternal uterus, adnexal regions, and pelvic cul-de-sac. Transvaginal technique was performed to assess early pregnancy. COMPARISON:  None available. FINDINGS: Intrauterine gestational sac: Negative. Yolk sac:   Negative. Embryo:  Negative. Cardiac Activity: Negative. Subchorionic hemorrhage:  None visualized. Maternal uterus/adnexae: Ovaries within normal limits bilaterally. Degenerating corpus luteal cyst noted within the left ovary. No adnexal mass or free fluid. Uterus is anteverted. Endometrial stripe measures approximately 8 mm in thickness with internal microcystic change. Small amount of simple fluid noted within the endometrial cavity. IMPRESSION: 1. Early pregnancy with no discrete IUP or adnexal mass identified. Finding is consistent with a pregnancy of unknown anatomic location. Differential considerations include IUP to early to visualize, recent SAB, or possibly occult ectopic pregnancy. Close clinical monitoring with serial beta HCGs and close interval follow-up ultrasound recommended as clinically warranted. 2. No other acute maternal uterine or adnexal abnormality. Electronically Signed   By: Rise Mu M.D.   On: 08/06/2020 01:04  ' MAU Management/MDM: Ordered usual first trimester r/o ectopic labs.   Pelvic exam and cultures done Will check baseline Ultrasound to rule out ectopic.  This bleeding/pain can represent a normal pregnancy with bleeding, spontaneous abortion or even an ectopic which can be life-threatening.  The process as listed above helps to determine which of these is present.  Discussed results with patient and discussed followup with Dr Reina Fuse.  Patient told we cannot know for sure at this time whether this is a viable pregnancy and cannot rule out ectopic yet.  Recommend repeat HCG on Friday (Dr Reina Fuse will arrange that and followup US).    ASSESSMENT 1. Pregnancy of unknown anatomic location   2. Pelvic pain affecting pregnancy in first trimester, antepartum   3. Pregnancy of unknown anatomic location   4. Pelvic pain affecting pregnancy in first trimester, antepartum     PLAN Discharge home Plan to repeat HCG level in 48 hours in office per Dr  Reina Fuse Will repeat  Ultrasound in about 7-10 days if HCG levels double appropriately  Ectopic precautions  Pt stable at time of discharge. Encouraged to return here if she develops worsening of symptoms, increase in pain, fever, or other concerning symptoms.    Wynelle Bourgeois CNM, MSN Certified Nurse-Midwife 08/06/2020  12:05 AM

## 2020-08-06 NOTE — Discharge Instructions (Signed)
Threatened Miscarriage A threatened miscarriage is when a woman bleeds in her vagina during the first 20 weeks of pregnancy but the pregnancy has not ended. The doctor will do tests to make sure you are still pregnant. This condition does not mean your pregnancy will end, but it does increase the risk that it will end (miscarriage). What are the causes? Normally, the cause of this condition is not known. What increases the risk? These things may make a pregnant woman more likely to lose a pregnancy: Certain health problems  Conditions that affect hormones, such as thyroid disease or polycystic ovary syndrome.  Diabetes.  Disorders that cause the body's disease-fighting system to attack itself by mistake.  Infections.  Bleeding problems.  Being very overweight. Lifestyle factors  Using products that have tobacco or nicotine in them.  Being around tobacco smoke.  Having a lot of caffeine.  Using drugs. Problems with reproductive organs or parts  Having a cervix that opens and thins before you are ready to give birth. The cervix is the lowest part of the womb.  Having Asherman syndrome, which leads to: ? Scars in the womb. ? The womb being an abnormal shape.  Growths (fibroids) in the womb.  Problems in the body that are present at birth.  Infection of the cervix or womb. Personal or health history  Injury.  Having lost an unborn baby before.  Being younger than age 18 or older than age 35.  Being around a harmful substance, such as radiation.  Having lead or other heavy metals in: ? Things you eat or drink. ? The air around you.  Using certain medicines. What are the signs or symptoms?  Bleeding from the vagina. You may also have cramps or pain.  Mild pain or cramps in your belly. How is this diagnosed?  The doctor will do tests, such as an ultrasound to make sure you are still pregnant.   How is this treated? There are no treatments that prevent loss of  pregnancy. But you need to do the right things to take care of yourself at home. Follow these instructions at home:  Get a lot of rest.  Do not have sex or douche if there is bleeding in the vagina.  Do not put things such as tampons in the vagina if it is bleeding.  Do not smoke or use drugs.  Do not drink alcohol.  Avoid caffeine.  Keep all follow-up visits while you are pregnant. Contact a doctor if:  You are pregnant and you have one of these: ? Light bleeding coming from your vagina. ? Spots of blood coming from your vagina.  You have belly pain or cramping.  You have a fever. Get help right away if:  Blood soaks through 2 large pads an hour for more than 2 hours.  Clots of blood come from your vagina.  Tissue comes out of your vagina.  Fluid leaks or gushes from your vagina.  You have very bad pain in your low back.  You have very bad cramps in your belly.  You have a fever, chills, and very bad belly pain. Summary  A threatened miscarriage is when a woman bleeds in her vagina during the first 20 weeks of pregnancy but the pregnancy has not ended.  Normally, the cause of this condition is not known.  Symptoms include bleeding in the vagina or mild pain or cramps in your belly.  There are no treatments that prevent loss of pregnancy.  Keep   all follow-up visits while you are pregnant. This information is not intended to replace advice given to you by your health care provider. Make sure you discuss any questions you have with your health care provider. Document Revised: 09/28/2019 Document Reviewed: 09/28/2019 Elsevier Patient Education  2021 [employer]vier Inc.  

## 2020-09-23 LAB — OB RESULTS CONSOLE HEPATITIS B SURFACE ANTIGEN: Hepatitis B Surface Ag: NEGATIVE

## 2020-09-23 LAB — OB RESULTS CONSOLE HIV ANTIBODY (ROUTINE TESTING): HIV: NONREACTIVE

## 2020-09-23 LAB — OB RESULTS CONSOLE RUBELLA ANTIBODY, IGM: Rubella: IMMUNE

## 2020-09-23 LAB — OB RESULTS CONSOLE GC/CHLAMYDIA: Gonorrhea: NEGATIVE

## 2020-09-23 LAB — HEPATITIS C ANTIBODY: HCV Ab: NEGATIVE

## 2020-10-10 DIAGNOSIS — Z8616 Personal history of COVID-19: Secondary | ICD-10-CM

## 2020-10-10 HISTORY — DX: Personal history of COVID-19: Z86.16

## 2020-11-10 DIAGNOSIS — Z8616 Personal history of COVID-19: Secondary | ICD-10-CM

## 2020-11-10 HISTORY — DX: Personal history of COVID-19: Z86.16

## 2020-12-28 ENCOUNTER — Encounter (HOSPITAL_COMMUNITY): Payer: Self-pay | Admitting: Obstetrics and Gynecology

## 2020-12-28 ENCOUNTER — Inpatient Hospital Stay (HOSPITAL_COMMUNITY)
Admission: AD | Admit: 2020-12-28 | Discharge: 2020-12-28 | Disposition: A | Discharge status: Home / Self Care | Payer: Medicaid Other | Source: Ambulatory Visit | Attending: Obstetrics and Gynecology | Admitting: Obstetrics and Gynecology

## 2020-12-28 ENCOUNTER — Other Ambulatory Visit: Payer: Self-pay

## 2020-12-28 DIAGNOSIS — O4702 False labor before 37 completed weeks of gestation, second trimester: Secondary | ICD-10-CM | POA: Diagnosis present

## 2020-12-28 DIAGNOSIS — Z3A25 25 weeks gestation of pregnancy: Secondary | ICD-10-CM

## 2020-12-28 DIAGNOSIS — O99332 Smoking (tobacco) complicating pregnancy, second trimester: Secondary | ICD-10-CM | POA: Insufficient documentation

## 2020-12-28 DIAGNOSIS — Z3689 Encounter for other specified antenatal screening: Secondary | ICD-10-CM

## 2020-12-28 DIAGNOSIS — F17210 Nicotine dependence, cigarettes, uncomplicated: Secondary | ICD-10-CM | POA: Diagnosis not present

## 2020-12-28 LAB — WET PREP, GENITAL
Clue Cells Wet Prep HPF POC: NONE SEEN
Sperm: NONE SEEN
Trich, Wet Prep: NONE SEEN
Yeast Wet Prep HPF POC: NONE SEEN

## 2020-12-28 LAB — URINALYSIS, ROUTINE W REFLEX MICROSCOPIC
Glucose, UA: NEGATIVE mg/dL
Hgb urine dipstick: NEGATIVE
Ketones, ur: 15 mg/dL — AB
Nitrite: NEGATIVE
Protein, ur: NEGATIVE mg/dL
Specific Gravity, Urine: 1.015 (ref 1.005–1.030)
pH: 6.5 (ref 5.0–8.0)

## 2020-12-28 LAB — URINALYSIS, MICROSCOPIC (REFLEX)

## 2020-12-28 NOTE — MAU Note (Addendum)
.  [name] is a 35 y.o. at [redacted]w[redacted]d here in MAU reporting: ctx every 15 minutes. Denies VB or LOF. Endorses good fetal movement. Rates pain 8/10 in lower abdomen that radiates to lower back. Had diarrhea all last night. Had x1 episode of emesis.   Lab orders placed from triage:  UA

## 2020-12-28 NOTE — MAU Provider Note (Signed)
History     CSN: [id number]  Arrival date and time: 12/28/20 2037   Event Date/Time   First Provider Initiated Contact with Patient 12/28/20 2123      Chief Complaint  Patient presents with   Contractions   35 y.o. [id number] @25 .1 wks presenting with ctx. Reports onset around 9am today. Frequency is q20 min. Denies VB or LOF. Denies vaginal discharge, itching, or malodor. Reports +FM. Reports several episodes of diarrhea last night. Had 1 episode of vomiting. Denies sick contacts. She is tolerating po w/o difficulty today. She reports an uncomplicated pregnancy.    OB History     Gravida  11   Para  6   Term  6   Preterm      AB  3   Living  6      SAB  3   IAB      Ectopic      Multiple  0   Live Births  6           Past Medical History:  Diagnosis Date   History of chicken pox    History of chlamydia    Ruptured intervertebral disc    S1    Past Surgical History:  Procedure Laterality Date   BREAST SURGERY     2 llumps remosved in 2007   CHOLECYSTECTOMY  2010    Family History  Problem Relation Age of Onset   Diabetes Maternal Grandmother    Hypertension Maternal Grandmother    Diabetes Paternal Grandmother    Hypertension Paternal Grandmother    Diabetes Father    Cancer Paternal Aunt    Cancer Mother        cervical cancer    Social History   Tobacco Use   Smoking status: Every Day    Packs/day: 1.00    Types: Cigarettes   Smokeless tobacco: Never  Substance Use Topics   Alcohol use: No   Drug use: No    Allergies: No Known Allergies  No medications prior to admission.    Review of Systems  Constitutional:  Negative for fever.  Gastrointestinal:  Positive for diarrhea and vomiting. Negative for abdominal pain.  Genitourinary:  Negative for dysuria, hematuria, urgency, vaginal bleeding and vaginal discharge.  Physical Exam   Blood pressure 95/65, pulse 84, temperature 98.9 F (37.2 C), temperature source Oral, resp.  rate 15, last menstrual period 07/05/2020, unknown if currently breastfeeding.  Physical Exam Vitals and nursing note reviewed. Exam conducted with a chaperone present.  Constitutional:      General: She is not in acute distress (appears comfortable).    Appearance: Normal appearance.  HENT:     Head: Normocephalic and atraumatic.  Cardiovascular:     Rate and Rhythm: Normal rate.  Pulmonary:     Effort: Pulmonary effort is normal. No respiratory distress.  Abdominal:     Palpations: Abdomen is soft.     Tenderness: There is no abdominal tenderness.  Genitourinary:    Comments: VE: closed/thick Musculoskeletal:        General: Normal range of motion.     Cervical back: Normal range of motion.  Skin:    General: Skin is warm and dry.  Neurological:     General: No focal deficit present.     Mental Status: She is alert and oriented to person, place, and time.  Psychiatric:        Mood and Affect: Mood normal.        Behavior:  Behavior normal.  EFM: 140 bpm, mod variability, + accels, no decels Toco: UI  Results for orders placed or performed during the hospital encounter of 12/28/20 (from the past 24 hour(s))  Urinalysis, Routine w reflex microscopic Urine, Clean Catch     Status: Abnormal   Collection Time: 12/28/20  8:53 PM  Result Value Ref Range   Color, Urine YELLOW YELLOW   APPearance CLEAR CLEAR   Specific Gravity, Urine 1.015 1.005 - 1.030   pH 6.5 5.0 - 8.0   Glucose, UA NEGATIVE NEGATIVE mg/dL   Hgb urine dipstick NEGATIVE NEGATIVE   Bilirubin Urine SMALL (A) NEGATIVE   Ketones, ur 15 (A) NEGATIVE mg/dL   Protein, ur NEGATIVE NEGATIVE mg/dL   Nitrite NEGATIVE NEGATIVE   Leukocytes,Ua TRACE (A) NEGATIVE  Urinalysis, Microscopic (reflex)     Status: Abnormal   Collection Time: 12/28/20  8:53 PM  Result Value Ref Range   RBC / HPF 0-5 0 - 5 RBC/hpf   WBC, UA 0-5 0 - 5 WBC/hpf   Bacteria, UA RARE (A) NONE SEEN   Squamous Epithelial / LPF 6-10 0 - 5   Mucus  PRESENT   Wet prep, genital     Status: Abnormal   Collection Time: 12/28/20  9:34 PM   Specimen: Vaginal  Result Value Ref Range   Yeast Wet Prep HPF POC NONE SEEN NONE SEEN   Trich, Wet Prep NONE SEEN NONE SEEN   Clue Cells Wet Prep HPF POC NONE SEEN NONE SEEN   WBC, Wet Prep HPF POC MANY (A) NONE SEEN   Sperm NONE SEEN    MAU Course  Procedures  MDM Labs ordered and reviewed. Prenatal records not on file. No signs of PTL, UTI. Mild dehydration from diarrhea may have cause BH ctx. Pt reassured. Pt requests discharge home and is stable to go.   Assessment and Plan   1. [redacted] weeks gestation of pregnancy   2. NST (non-stress test) reactive   3. Preterm uterine contractions in second trimester, antepartum    Discharge home Follow up at [facility] as scheduled PTL precautions Increase hydration  Allergies as of 12/28/2020   No Known Allergies      Medication List     TAKE these medications    amphetamine-dextroamphetamine 10 MG tablet Commonly known as: ADDERALL Take 10 mg by mouth daily with breakfast.   buPROPion 75 MG tablet Commonly known as: WELLBUTRIN Take 75 mg by mouth 2 (two) times daily.   hydrOXYzine 100 MG capsule Commonly known as: VISTARIL Take 100 mg by mouth once.   multivitamin-prenatal 27-0.8 MG Tabs tablet Take 1 tablet by mouth daily at 12 noon.   ProAir HFA 108 (90 Base) MCG/ACT inhaler Generic drug: albuterol Inhale 2 puffs into the lungs every 6 (six) hours as needed.   ranitidine 150 MG tablet Commonly known as: ZANTAC Take 300 mg by mouth daily.       Donette Larry, CNM 12/28/2020, 10:21 PM

## 2020-12-29 LAB — GC/CHLAMYDIA PROBE AMP (~~LOC~~) NOT AT ARMC
Chlamydia: NEGATIVE
Comment: NEGATIVE
Comment: NORMAL
Neisseria Gonorrhea: NEGATIVE

## 2021-01-13 LAB — OB RESULTS CONSOLE RPR: RPR: NONREACTIVE

## 2021-03-17 LAB — OB RESULTS CONSOLE GBS: GBS: NEGATIVE

## 2021-04-03 ENCOUNTER — Inpatient Hospital Stay (HOSPITAL_COMMUNITY): Payer: Medicaid Other

## 2021-04-03 ENCOUNTER — Other Ambulatory Visit: Payer: Self-pay | Admitting: Obstetrics and Gynecology

## 2021-04-04 ENCOUNTER — Inpatient Hospital Stay (HOSPITAL_COMMUNITY)
Admission: AD | Admit: 2021-04-04 | Discharge: 2021-04-06 | DRG: 807 | Disposition: A | Discharge status: Home / Self Care | Payer: Medicaid Other | Attending: Obstetrics and Gynecology | Admitting: Obstetrics and Gynecology

## 2021-04-04 ENCOUNTER — Encounter (HOSPITAL_COMMUNITY): Payer: Self-pay | Admitting: Obstetrics and Gynecology

## 2021-04-04 ENCOUNTER — Other Ambulatory Visit: Payer: Self-pay

## 2021-04-04 ENCOUNTER — Inpatient Hospital Stay (HOSPITAL_COMMUNITY): Payer: Medicaid Other | Admitting: Anesthesiology

## 2021-04-04 DIAGNOSIS — O99334 Smoking (tobacco) complicating childbirth: Secondary | ICD-10-CM | POA: Diagnosis present

## 2021-04-04 DIAGNOSIS — F17210 Nicotine dependence, cigarettes, uncomplicated: Secondary | ICD-10-CM | POA: Diagnosis present

## 2021-04-04 DIAGNOSIS — Z20822 Contact with and (suspected) exposure to covid-19: Secondary | ICD-10-CM | POA: Diagnosis present

## 2021-04-04 DIAGNOSIS — Z3A39 39 weeks gestation of pregnancy: Secondary | ICD-10-CM

## 2021-04-04 DIAGNOSIS — O26893 Other specified pregnancy related conditions, third trimester: Secondary | ICD-10-CM | POA: Diagnosis present

## 2021-04-04 LAB — CBC
HCT: 34.1 % — ABNORMAL LOW (ref 36.0–46.0)
Hemoglobin: 11.4 g/dL — ABNORMAL LOW (ref 12.0–15.0)
MCH: 31 pg (ref 26.0–34.0)
MCHC: 33.4 g/dL (ref 30.0–36.0)
MCV: 92.7 fL (ref 80.0–100.0)
Platelets: 334 10*3/uL (ref 150–400)
RBC: 3.68 MIL/uL — ABNORMAL LOW (ref 3.87–5.11)
RDW: 12.7 % (ref 11.5–15.5)
WBC: 11.9 10*3/uL — ABNORMAL HIGH (ref 4.0–10.5)
nRBC: 0 % (ref 0.0–0.2)

## 2021-04-04 LAB — RESP PANEL BY RT-PCR (FLU A&B, COVID) ARPGX2
Influenza A by PCR: NEGATIVE
Influenza B by PCR: NEGATIVE
SARS Coronavirus 2 by RT PCR: NEGATIVE

## 2021-04-04 LAB — TYPE AND SCREEN
ABO/RH(D): O POS
Antibody Screen: NEGATIVE

## 2021-04-04 MED ORDER — OXYTOCIN BOLUS FROM INFUSION
333.0000 mL | Freq: Once | INTRAVENOUS | Status: AC
  Administered 2021-04-04: 20:00:00 333 mL via INTRAVENOUS

## 2021-04-04 MED ORDER — PHENYLEPHRINE 40 MCG/ML (10ML) SYRINGE FOR IV PUSH (FOR BLOOD PRESSURE SUPPORT): 80.0000 ug | PREFILLED_SYRINGE | INTRAVENOUS | Status: DC | PRN

## 2021-04-04 MED ORDER — METHYLERGONOVINE MALEATE 0.2 MG PO TABS: 0.2000 mg | ORAL_TABLET | ORAL | Status: DC | PRN

## 2021-04-04 MED ORDER — ZOLPIDEM TARTRATE 5 MG PO TABS: 5.0000 mg | ORAL_TABLET | Freq: Every evening | ORAL | Status: DC | PRN

## 2021-04-04 MED ORDER — ACETAMINOPHEN 325 MG PO TABS: 650.0000 mg | ORAL_TABLET | ORAL | Status: DC | PRN

## 2021-04-04 MED ORDER — ONDANSETRON HCL 4 MG PO TABS: 4.0000 mg | ORAL_TABLET | ORAL | Status: DC | PRN

## 2021-04-04 MED ORDER — LACTATED RINGERS IV SOLN: INTRAVENOUS | Status: DC

## 2021-04-04 MED ORDER — LACTATED RINGERS IV SOLN: 500.0000 mL | INTRAVENOUS | Status: DC | PRN

## 2021-04-04 MED ORDER — TETANUS-DIPHTH-ACELL PERTUSSIS 5-2.5-18.5 LF-MCG/0.5 IM SUSY: 0.5000 mL | PREFILLED_SYRINGE | Freq: Once | INTRAMUSCULAR | Status: DC

## 2021-04-04 MED ORDER — SOD CITRATE-CITRIC ACID 500-334 MG/5ML PO SOLN: 30.0000 mL | ORAL | Status: DC | PRN

## 2021-04-04 MED ORDER — EPHEDRINE 5 MG/ML INJ: 10.0000 mg | INTRAVENOUS | Status: DC | PRN

## 2021-04-04 MED ORDER — ONDANSETRON HCL 4 MG/2ML IJ SOLN: 4.0000 mg | Freq: Four times a day (QID) | INTRAMUSCULAR | Status: DC | PRN

## 2021-04-04 MED ORDER — LIDOCAINE HCL (PF) 1 % IJ SOLN: 30.0000 mL | INTRAMUSCULAR | Status: DC | PRN

## 2021-04-04 MED ORDER — METHYLERGONOVINE MALEATE 0.2 MG/ML IJ SOLN: 0.2000 mg | INTRAMUSCULAR | Status: DC | PRN

## 2021-04-04 MED ORDER — OXYCODONE-ACETAMINOPHEN 5-325 MG PO TABS: 1.0000 | ORAL_TABLET | ORAL | Status: DC | PRN

## 2021-04-04 MED ORDER — BUTORPHANOL TARTRATE 1 MG/ML IJ SOLN: 1.0000 mg | INTRAMUSCULAR | Status: DC | PRN

## 2021-04-04 MED ORDER — ONDANSETRON HCL 4 MG/2ML IJ SOLN: 4.0000 mg | INTRAMUSCULAR | Status: DC | PRN

## 2021-04-04 MED ORDER — SENNOSIDES-DOCUSATE SODIUM 8.6-50 MG PO TABS
2.0000 | ORAL_TABLET | Freq: Every day | ORAL | Status: DC
  Administered 2021-04-05 – 2021-04-06 (×2): 2 via ORAL
  Filled 2021-04-04 (×2): qty 2

## 2021-04-04 MED ORDER — BENZOCAINE-MENTHOL 20-0.5 % EX AERO
1.0000 "application " | INHALATION_SPRAY | CUTANEOUS | Status: DC | PRN
  Filled 2021-04-04: qty 56

## 2021-04-04 MED ORDER — OXYTOCIN-SODIUM CHLORIDE 30-0.9 UT/500ML-% IV SOLN: 2.5000 [IU]/h | INTRAVENOUS | Status: DC

## 2021-04-04 MED ORDER — PRENATAL MULTIVITAMIN CH
1.0000 | ORAL_TABLET | Freq: Every day | ORAL | Status: DC
  Administered 2021-04-06: 13:00:00 1 via ORAL
  Filled 2021-04-04 (×2): qty 1

## 2021-04-04 MED ORDER — DIPHENHYDRAMINE HCL 25 MG PO CAPS: 25.0000 mg | ORAL_CAPSULE | Freq: Four times a day (QID) | ORAL | Status: DC | PRN

## 2021-04-04 MED ORDER — SIMETHICONE 80 MG PO CHEW: 80.0000 mg | CHEWABLE_TABLET | ORAL | Status: DC | PRN

## 2021-04-04 MED ORDER — TERBUTALINE SULFATE 1 MG/ML IJ SOLN: 0.2500 mg | Freq: Once | INTRAMUSCULAR | Status: DC | PRN

## 2021-04-04 MED ORDER — FENTANYL-BUPIVACAINE-NACL 0.5-0.125-0.9 MG/250ML-% EP SOLN
12.0000 mL/h | EPIDURAL | Status: DC | PRN
  Administered 2021-04-04: 14:00:00 12 mL/h via EPIDURAL
  Filled 2021-04-04: qty 250

## 2021-04-04 MED ORDER — OXYCODONE-ACETAMINOPHEN 5-325 MG PO TABS: 2.0000 | ORAL_TABLET | ORAL | Status: DC | PRN

## 2021-04-04 MED ORDER — COCONUT OIL OIL: 1.0000 "application " | TOPICAL_OIL | Status: DC | PRN

## 2021-04-04 MED ORDER — WITCH HAZEL-GLYCERIN EX PADS
1.0000 "application " | MEDICATED_PAD | CUTANEOUS | Status: DC | PRN
  Administered 2021-04-06: 1 via TOPICAL

## 2021-04-04 MED ORDER — DIBUCAINE (PERIANAL) 1 % EX OINT: 1.0000 "application " | TOPICAL_OINTMENT | CUTANEOUS | Status: DC | PRN

## 2021-04-04 MED ORDER — DIPHENHYDRAMINE HCL 50 MG/ML IJ SOLN: 12.5000 mg | INTRAMUSCULAR | Status: DC | PRN

## 2021-04-04 MED ORDER — OXYTOCIN-SODIUM CHLORIDE 30-0.9 UT/500ML-% IV SOLN: 2.5000 [IU]/h | INTRAVENOUS | Status: DC | PRN

## 2021-04-04 MED ORDER — IBUPROFEN 600 MG PO TABS
600.0000 mg | ORAL_TABLET | Freq: Four times a day (QID) | ORAL | Status: DC
  Administered 2021-04-05 – 2021-04-06 (×5): 600 mg via ORAL
  Filled 2021-04-04 (×7): qty 1

## 2021-04-04 MED ORDER — LIDOCAINE HCL (PF) 1 % IJ SOLN
INTRAMUSCULAR | Status: DC | PRN
  Administered 2021-04-04: 4 mL via EPIDURAL
  Administered 2021-04-04: 5 mL via EPIDURAL

## 2021-04-04 MED ORDER — OXYTOCIN-SODIUM CHLORIDE 30-0.9 UT/500ML-% IV SOLN
1.0000 m[IU]/min | INTRAVENOUS | Status: DC
  Administered 2021-04-04: 12:00:00 2 m[IU]/min via INTRAVENOUS
  Filled 2021-04-04: qty 500

## 2021-04-04 MED ORDER — LACTATED RINGERS IV SOLN: 500.0000 mL | Freq: Once | INTRAVENOUS | Status: DC

## 2021-04-04 NOTE — Anesthesia Preprocedure Evaluation (Signed)
Anesthesia Evaluation  Patient identified by MRN, date of birth, ID band Patient awake    Reviewed: Allergy & Precautions, Patient's Chart, lab work & pertinent test results  Airway Mallampati: II       Dental no notable dental hx.    Pulmonary asthma , Current SmokerPatient did not abstain from smoking.,    Pulmonary exam normal        Cardiovascular Normal cardiovascular exam     Neuro/Psych ADDnegative neurological ROS     GI/Hepatic Neg liver ROS, GERD  Medicated,  Endo/Other  negative endocrine ROS  Renal/GU negative Renal ROS  negative genitourinary   Musculoskeletal negative musculoskeletal ROS (+)   Abdominal   Peds  Hematology  (+) anemia ,   Anesthesia Other Findings   Reproductive/Obstetrics (+) Pregnancy                             Anesthesia Physical Anesthesia Plan  ASA: 2  Anesthesia Plan: Epidural   Post-op Pain Management:    Induction:   PONV Risk Score and Plan:   Airway Management Planned: Natural Airway  Additional Equipment:   Intra-op Plan:   Post-operative Plan:   Informed Consent: I have reviewed the patients History and Physical, chart, labs and discussed the procedure including the risks, benefits and alternatives for the proposed anesthesia with the patient or authorized representative who has indicated his/her understanding and acceptance.       Plan Discussed with: Anesthesiologist  Anesthesia Plan Comments:         Anesthesia Quick Evaluation

## 2021-04-04 NOTE — Anesthesia Procedure Notes (Signed)
Epidural Patient location during procedure: OB Start time: 04/04/2021 1:34 PM End time: 04/04/2021 1:43 PM  Staffing Anesthesiologist: Mal Amabile, MD Performed: anesthesiologist   Preanesthetic Checklist Completed: patient identified, IV checked, site marked, risks and benefits discussed, surgical consent, monitors and equipment checked, pre-op evaluation and timeout performed  Epidural Patient position: sitting Prep: DuraPrep and site prepped and draped Patient monitoring: continuous pulse ox and blood pressure Approach: midline Location: L3-L4 Injection technique: LOR air  Needle:  Needle type: Tuohy  Needle gauge: 17 G Needle length: 9 cm and 9 Needle insertion depth: 5 cm Catheter type: closed end flexible Catheter size: 19 Gauge Catheter at skin depth: 10 cm Test dose: negative and Other  Assessment Events: blood not aspirated, injection not painful, no injection resistance, no paresthesia and negative IV test  Additional Notes Patient identified. Risks and benefits discussed including failed block, incomplete  Pain control, post dural puncture headache, nerve damage, paralysis, blood pressure Changes, nausea, vomiting, reactions to medications-both toxic and allergic and post Partum back pain. All questions were answered. Patient expressed understanding and wished to proceed. Sterile technique was used throughout procedure. Epidural site was Dressed with sterile barrier dressing. No paresthesias, signs of intravascular injection Or signs of intrathecal spread were encountered.  Patient was more comfortable after the epidural was dosed. Please see RN's note for documentation of vital signs and FHR which are stable. Reason for block:procedure for pain

## 2021-04-04 NOTE — H&P (Signed)
[name] is a 35 y.o. female presenting for elective IOL  35 yo [id number] @ 39+1 presents for EIOL. Her pregnancy has been uncomplicated to this point OB History     Gravida  11   Para  6   Term  6   Preterm      AB  3   Living  6      SAB  3   IAB      Ectopic      Multiple  0   Live Births  6          Past Medical History:  Diagnosis Date   History of chicken pox    History of chlamydia    Ruptured intervertebral disc    S1   Past Surgical History:  Procedure Laterality Date   BREAST SURGERY     2 llumps remosved in 2007   CHOLECYSTECTOMY  2010   Family History: family history includes Cancer in her mother and paternal aunt; Diabetes in her father, maternal grandmother, and paternal grandmother; Hypertension in her maternal grandmother and paternal grandmother. Social History:  reports that she has been smoking cigarettes. She has been smoking an average of 1 pack per day. She has never used smokeless tobacco. She reports that she does not drink alcohol and does not use drugs.     Maternal Diabetes: No Genetic Screening: Normal Maternal Ultrasounds/Referrals: Normal Fetal Ultrasounds or other Referrals:  None Maternal Substance Abuse:  No Significant Maternal Medications:  None Significant Maternal Lab Results:  None Other Comments:  None  Review of Systems History Dilation: 6 Effacement (%): 90 Station: -2 Exam by:: [employer] RN Blood pressure 109/72, pulse 76, temperature 98.1 F (36.7 C), temperature source Oral, resp. rate 16, height 5\' 4"  (1.626 m), weight 74.3 kg, last menstrual period 07/05/2020, SpO2 97 %, unknown if currently breastfeeding. Exam Physical Exam  Prenatal labs: ABO, Rh: --/--/O POS (12/24 1203) Antibody: NEG (12/24 1203) Rubella: Immune (06/14 0000) RPR: Nonreactive (10/04 0000)  HBsAg: Negative (06/14 0000)  HIV: Non-reactive (06/14 0000)  GBS: Negative/-- (12/06 0000)   Assessment/Plan: 1) Admit 2)  AROM/pit 3) Epidural on request   10-30-1983 04/04/2021, 7:13 PM

## 2021-04-05 LAB — CBC
HCT: 28.1 % — ABNORMAL LOW (ref 36.0–46.0)
Hemoglobin: 9.5 g/dL — ABNORMAL LOW (ref 12.0–15.0)
MCH: 31.4 pg (ref 26.0–34.0)
MCHC: 33.8 g/dL (ref 30.0–36.0)
MCV: 92.7 fL (ref 80.0–100.0)
Platelets: 250 10*3/uL (ref 150–400)
RBC: 3.03 MIL/uL — ABNORMAL LOW (ref 3.87–5.11)
RDW: 12.8 % (ref 11.5–15.5)
WBC: 10.8 10*3/uL — ABNORMAL HIGH (ref 4.0–10.5)
nRBC: 0 % (ref 0.0–0.2)

## 2021-04-05 LAB — RPR: RPR Ser Ql: NONREACTIVE

## 2021-04-05 NOTE — Progress Notes (Signed)
Post Partum Day 1 Subjective: no complaints, up ad lib, voiding, and tolerating PO  Objective: Blood pressure 113/66, pulse 72, temperature 97.7 F (36.5 C), resp. rate 16, height 5\' 4"  (1.626 m), weight 74.3 kg, last menstrual period 07/05/2020, SpO2 98 %, unknown if currently breastfeeding.  Physical Exam:  General: alert, cooperative, and appears stated age Lochia: appropriate Uterine Fundus: firm DVT Evaluation: No evidence of DVT seen on physical exam.  Recent Labs    04/04/21 1205 04/05/21 0402  HGB 11.4* 9.5*  HCT 34.1* 28.1*    Assessment/Plan: Plan for discharge tomorrow Bottle feeding Desires neonatal circumcision, R/B/A of procedure discussed at length. Pt understands that neonatal circumcision is not considered medically necessary and is elective. The risks include, but are not limited to bleeding, infection, damage to the penis, development of scar tissue, and having to have it redone at a later date. Pt understands theses risks and wishes to proceed Baby has yet to void will postpone until tomorrow  LOS: 1 day   04/07/21 04/05/2021, 11:33 AM
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
## 2021-04-05 NOTE — Anesthesia Postprocedure Evaluation (Signed)
Anesthesia Post Note  Patient: [name]  Procedure(s) Performed: AN AD HOC LABOR EPIDURAL     Patient location during evaluation: Mother Baby Anesthesia Type: Epidural Level of consciousness: awake and alert and oriented Pain management: satisfactory to patient Vital Signs Assessment: post-procedure vital signs reviewed and stable Respiratory status: respiratory function stable Cardiovascular status: stable Postop Assessment: no headache, no backache, epidural receding, patient able to bend at knees, no signs of nausea or vomiting, adequate PO intake and able to ambulate Anesthetic complications: no   No notable events documented.  Last Vitals:  Vitals:   04/05/21 0258 04/05/21 0544  BP: 94/64 106/66  Pulse: (!) 59 64  Resp:    Temp: 36.8 C   SpO2:      Last Pain:  Vitals:   04/05/21 0551  TempSrc:   PainSc: 0-No pain   Pain Goal:                   Ariana Cavenaugh

## 2021-04-06 MED ORDER — IBUPROFEN 800 MG PO TABS: 800.0000 mg | ORAL_TABLET | Freq: Three times a day (TID) | ORAL | 1 refills | Status: DC | PRN

## 2021-04-06 NOTE — Discharge Summary (Signed)
Postpartum Discharge Summary  Date of Service updated     Patient Name: [name] DOB: [birth year] MRN: [id number]  Date of admission: 04/04/2021 Delivery date:04/04/2021  Delivering provider: Waynard Reeds  Date of discharge: 04/06/2021  Admitting diagnosis: Indication for care in labor or delivery [O75.9] Spontaneous vaginal delivery [O80] Intrauterine pregnancy: [redacted]w[redacted]d     Secondary diagnosis:  Principal Problem:   Indication for care in labor or delivery Active Problems:   Spontaneous vaginal delivery  Additional problems: grand multip    Discharge diagnosis: Term Pregnancy Delivered                                              Post partum procedures: none Augmentation: AROM and Pitocin Complications: None  Hospital course: Induction of Labor With Vaginal Delivery   35 y.o. yo [id number] at [redacted]w[redacted]d was admitted to the hospital 04/04/2021 for induction of labor.  Indication for induction: Favorable cervix at term.  Patient had an uncomplicated labor course as follows: Membrane Rupture Time/Date: 2:16 PM ,04/04/2021   Delivery Method:Vaginal, Spontaneous  Episiotomy: None  Lacerations:  None  Details of delivery can be found in separate delivery note.  Patient had a routine postpartum course. Patient is discharged home 04/06/21.  Newborn Data: Birth date:04/04/2021  Birth time:7:49 PM  Gender:Female  Living status:Living  Apgars:8 ,9  Weight:3070 g   Physical exam  Vitals:   04/05/21 0544 04/05/21 0935 04/05/21 1400 04/05/21 2014  BP: 106/66 113/66 117/77 117/75  Pulse: 64 72 67 71  Resp:  16 16   Temp:  97.7 F (36.5 C) 98.2 F (36.8 C) 98.3 F (36.8 C)  TempSrc:   Oral Axillary  SpO2:  98% 99%   Weight:      Height:       General: alert, cooperative, and no distress Lochia: appropriate Uterine Fundus: firm Incision: N/A DVT Evaluation: No evidence of DVT seen on physical exam. Negative Homan's sign. No cords or calf tenderness. Labs: Lab Results   Component Value Date   WBC 10.8 (H) 04/05/2021   HGB 9.5 (L) 04/05/2021   HCT 28.1 (L) 04/05/2021   MCV 92.7 04/05/2021   PLT 250 04/05/2021   CMP Latest Ref Rng & Units 08/06/2020  Glucose 70 - 99 mg/dL 94  BUN 6 - 20 mg/dL 6  Creatinine 6.33 - 3.54 mg/dL 5.62  Sodium 563 - 893 mmol/L 136  Potassium 3.5 - 5.1 mmol/L 3.2(L)  Chloride 98 - 111 mmol/L 105  CO2 22 - 32 mmol/L 22  Calcium 8.9 - 10.3 mg/dL 9.1  Total Protein 6.5 - 8.1 g/dL -  Total Bilirubin 0.3 - 1.2 mg/dL -  Alkaline Phos 38 - 734 U/L -  AST 15 - 41 U/L -  ALT 14 - 54 U/L -   Edinburgh Score: Edinburgh Postnatal Depression Scale Screening Tool 06/14/2017  I have been able to laugh and see the funny side of things. 0  I have looked forward with enjoyment to things. 0  I have blamed myself unnecessarily when things went wrong. 0  I have been anxious or worried for no good reason. 0  I have felt scared or panicky for no good reason. 0  Things have been getting on top of me. 0  I have been so unhappy that I have had difficulty sleeping. 0  I have felt sad  or miserable. 0  I have been so unhappy that I have been crying. 0  The thought of harming myself has occurred to me. 0  Edinburgh Postnatal Depression Scale Total 0      After visit meds:  Allergies as of 04/06/2021   No Known Allergies      Medication List     TAKE these medications    amphetamine-dextroamphetamine 10 MG tablet Commonly known as: ADDERALL Take 10 mg by mouth daily with breakfast.   buPROPion 75 MG tablet Commonly known as: WELLBUTRIN Take 75 mg by mouth 2 (two) times daily.   hydrOXYzine 100 MG capsule Commonly known as: VISTARIL Take 100 mg by mouth at bedtime.   ibuprofen 800 MG tablet Commonly known as: ADVIL Take 1 tablet (800 mg total) by mouth every 8 (eight) hours as needed.         Discharge home in stable condition Infant Feeding: Breast Infant Disposition:home with mother Discharge instruction: per After  Visit Summary and Postpartum booklet. Activity: Advance as tolerated. Pelvic rest for 6 weeks.  Diet: routine diet Anticipated Birth Control: Plans Interval BTL Postpartum Appointment:6 weeks      04/06/2021 Carlisle Cater, MD

## 2021-04-06 NOTE — Progress Notes (Addendum)
Post Partum Day 2 Subjective: no complaints, up ad lib, voiding, and tolerating PO  Objective: Blood pressure 117/75, pulse 71, temperature 98.3 F (36.8 C), temperature source Axillary, resp. rate 16, height 5\' 4"  (1.626 m), weight 74.3 kg, last menstrual period 07/05/2020, SpO2 99 %, unknown if currently breastfeeding.  Physical Exam:  General: alert, cooperative, and appears stated age 35: appropriate Uterine Fundus: firm DVT Evaluation: No evidence of DVT seen on physical exam.  Recent Labs    04/04/21 1205 04/05/21 0402  HGB 11.4* 9.5*  HCT 34.1* 28.1*     Assessment/Plan: Discharge home Bottle feeding Baby s/p circ CSW currently in room with patient Patient desires pp BTL - will flab for postpartum/preop visit appropriately  LOS: 2 days   04/07/21 04/06/2021, 9:57 AM
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
## 2021-04-16 ENCOUNTER — Telehealth (HOSPITAL_COMMUNITY): Payer: Self-pay

## 2021-04-16 NOTE — Telephone Encounter (Signed)
No answer. Left message to return nurse call.  Marcelino Duster [facility] 04/16/2021,1632

## 2021-07-28 ENCOUNTER — Other Ambulatory Visit: Payer: Self-pay

## 2021-07-28 ENCOUNTER — Encounter (HOSPITAL_BASED_OUTPATIENT_CLINIC_OR_DEPARTMENT_OTHER): Payer: Self-pay | Admitting: Obstetrics and Gynecology

## 2021-07-28 NOTE — Progress Notes (Signed)
Spoke w/ via phone for pre-op interview--- pt ?Lab needs dos----   cbc, t&s, urine preg            ?Lab results------ no ?COVID test -----patient states asymptomatic no test needed ?Arrive at ------- 0530 on 07-31-2021 ?NPO after MN NO Solid Food.  Clear liquids from MN until--- 0430 ?Med rec completed ?Medications to take morning of surgery ----- wellbutrin, vistaril ?Diabetic medication ----- n/a ?Patient instructed no nail polish to be worn day of surgery ?Patient instructed to bring photo id and insurance card day of surgery ?Patient aware to have Driver (ride ) / caregiver for 24 hours after surgery -- sister, robin ?Patient Special Instructions ----- n/a ?Pre-Op special Istructions ----- n/a ?Patient verbalized understanding of instructions that were given at this phone interview. ?Patient denies shortness of breath, chest pain, fever, cough at this phone interview.  ?

## 2021-07-29 NOTE — H&P (Signed)
Islam Schlachter is an 36 y.o. [phone] female presenting for scheduled laparoscopic bilateral tubal ligation due to complete family. Pt was offered reversible long term options but prefers permanent sterilization  ? ?Pertinent Gynecological History: ?Menses:  regular monthly  ?Bleeding: regular  ?Contraception: none ?DES exposure: denies ?Blood transfusions: none ?Sexually transmitted diseases: no past history ?Previous GYN Procedures:  n/a   ?Last mammogram:  n/a  Date:   ?Last pap: normal Date: 2022 ?OB History: G9, P7027  ? ?Menstrual History: ?Menarche age: 40 ?Patient's last menstrual period was 07/14/2021 (approximate). ?  ? ?Past Medical History:  ?Diagnosis Date  ? ADHD (attention deficit hyperactivity disorder)   ? History of chlamydia   ? History of COVID-19 11/2020  ? per pt mild symptoms that resolved  ? Ruptured intervertebral disc   ? S1,  per pt with chronic pain  ? Wears glasses   ? ? ?Past Surgical History:  ?Procedure Laterality Date  ? BREAST SURGERY Left 05/13/2005  ? @[facility] ;   excision fibroadenoma  ? CHOLECYSTECTOMY, LAPAROSCOPIC  11/17/2007  ? @[facility]   ? ? ?Family History  ?Problem Relation Age of Onset  ? Diabetes Maternal Grandmother   ? Hypertension Maternal Grandmother   ? Diabetes Paternal Grandmother   ? Hypertension Paternal Grandmother   ? Diabetes Father   ? Cancer Paternal Aunt   ? Cancer Mother   ?     cervical cancer  ? ? ?Social History:  reports that she has been smoking cigarettes. She has a 9.00 pack-year smoking history. She has never used smokeless tobacco. She reports that she does not drink alcohol and does not use drugs. ? ?Allergies: No Known Allergies ? ?No medications prior to admission.  ? ? ?Review of Systems  ?Constitutional:  Negative for activity change, fatigue and fever.  ?Eyes:  Negative for photophobia and visual disturbance.  ?Respiratory:  Negative for chest tightness and shortness of breath.   ?Cardiovascular:  Negative for chest pain, palpitations and leg  swelling.  ?Gastrointestinal:  Negative for abdominal pain.  ?Genitourinary:  Negative for pelvic pain, vaginal bleeding and vaginal discharge.  ?Musculoskeletal:  Negative for back pain.  ?Neurological:  Negative for light-headedness and headaches.  ?Psychiatric/Behavioral:  The patient is not nervous/anxious.   ? ?Height 5\' 3"  (1.6 m), weight 65.8 kg, last menstrual period 07/14/2021, not currently breastfeeding. ?Physical Exam ?Vitals and nursing note reviewed.  ?Constitutional:   ?   Appearance: Normal appearance.  ?Cardiovascular:  ?   Rate and Rhythm: Normal rate.  ?   Pulses: Normal pulses.  ?Pulmonary:  ?   Effort: Pulmonary effort is normal.  ?Musculoskeletal:     ?   General: Normal range of motion.  ?   Cervical back: Normal range of motion.  ?Skin: ?   General: Skin is warm and dry.  ?   Capillary Refill: Capillary refill takes 2 to 3 seconds.  ?Neurological:  ?   General: No focal deficit present.  ?   Mental Status: She is alert and oriented to person, place, and time.  ?Psychiatric:     ?   Mood and Affect: Mood normal.     ?   Behavior: Behavior normal.     ?   Thought Content: Thought content normal.  ? ? ?No results found for this or any previous visit (from the past 24 hour(s)). ? ?No results found. ? ?Assessment/Plan: ?36yo [id number] female here for permanent sterilization ?- Admit  ?- ERAS protocol ?- Verify consent  ?-  To OR when ready  ? ?Isaiah Serge ?07/29/2021, 10:50 AM ? ?

## 2021-07-30 ENCOUNTER — Ambulatory Visit (HOSPITAL_BASED_OUTPATIENT_CLINIC_OR_DEPARTMENT_OTHER): Payer: Medicaid Other | Admitting: Anesthesiology

## 2021-07-30 NOTE — Anesthesia Preprocedure Evaluation (Addendum)
Anesthesia Evaluation  ?Patient identified by MRN, date of birth, ID band ?Patient awake ? ? ? ?Reviewed: ?Allergy & Precautions, NPO status , Patient's Chart, lab work & pertinent test results ? ?Airway ? ? ? ? ? ? ? Dental ?  ?Pulmonary ?neg pulmonary ROS, Current Smoker,  ?  ? ? ? ? ? ? ? Cardiovascular ?negative cardio ROS ? ? ? ? ?  ?Neuro/Psych ?ADHDnegative neurological ROS ?   ? GI/Hepatic ?negative GI ROS, Neg liver ROS,   ?Endo/Other  ?negative endocrine ROS ? Renal/GU ?negative Renal ROS  ?negative genitourinary ?  ?Musculoskeletal ?Chronic pain, s1 ruptured disc  ? Abdominal ?  ?Peds ?negative pediatric ROS ?(+)  Hematology ?negative hematology ROS ?(+)   ?Anesthesia Other Findings ? ? Reproductive/Obstetrics ?negative OB ROS ? ?  ? ? ? ? ? ? ? ? ? ? ? ? ? ?  ?  ? ? ? ? ? ? ? ? ?Anesthesia Physical ?Anesthesia Plan ? ?ASA: 2 ? ?Anesthesia Plan: General  ? ?Post-op Pain Management: Tylenol PO (pre-op)*  ? ?Induction: Intravenous ? ?PONV Risk Score and Plan: Midazolam, Scopolamine patch - Pre-op, Treatment may vary due to age or medical condition, Dexamethasone and Ondansetron ? ?Airway Management Planned: Oral ETT ? ?Additional Equipment: None ? ?Intra-op Plan:  ? ?Post-operative Plan: Extubation in OR ? ?Informed Consent:  ? ?Plan Discussed with:  ? ?Anesthesia Plan Comments: (Patient presented with cough, sore throat, rhinorrhea, and overall not feeling well. Discussed with Dr. Mindi Slicker. Decision made to cancel surgery today. Tanna Furry, MD )  ? ? ? ? ? ?Anesthesia Quick Evaluation ? ?

## 2021-07-31 ENCOUNTER — Encounter (HOSPITAL_BASED_OUTPATIENT_CLINIC_OR_DEPARTMENT_OTHER)
Admission: RE | Disposition: A | Discharge status: Home / Self Care | Payer: Self-pay | Source: Home / Self Care | Attending: Obstetrics and Gynecology

## 2021-07-31 ENCOUNTER — Encounter (HOSPITAL_BASED_OUTPATIENT_CLINIC_OR_DEPARTMENT_OTHER): Payer: Self-pay | Admitting: Obstetrics and Gynecology

## 2021-07-31 ENCOUNTER — Ambulatory Visit (HOSPITAL_BASED_OUTPATIENT_CLINIC_OR_DEPARTMENT_OTHER)
Admission: RE | Admit: 2021-07-31 | Discharge: 2021-07-31 | Disposition: A | Discharge status: Home / Self Care | Payer: Medicaid Other | Attending: Obstetrics and Gynecology | Admitting: Obstetrics and Gynecology

## 2021-07-31 DIAGNOSIS — J029 Acute pharyngitis, unspecified: Secondary | ICD-10-CM | POA: Diagnosis not present

## 2021-07-31 DIAGNOSIS — Z302 Encounter for sterilization: Secondary | ICD-10-CM | POA: Diagnosis present

## 2021-07-31 DIAGNOSIS — R059 Cough, unspecified: Secondary | ICD-10-CM | POA: Insufficient documentation

## 2021-07-31 DIAGNOSIS — Z538 Procedure and treatment not carried out for other reasons: Secondary | ICD-10-CM | POA: Insufficient documentation

## 2021-07-31 DIAGNOSIS — J3489 Other specified disorders of nose and nasal sinuses: Secondary | ICD-10-CM | POA: Insufficient documentation

## 2021-07-31 DIAGNOSIS — Z20822 Contact with and (suspected) exposure to covid-19: Secondary | ICD-10-CM | POA: Insufficient documentation

## 2021-07-31 HISTORY — DX: Attention-deficit hyperactivity disorder, unspecified type: F90.9

## 2021-07-31 HISTORY — DX: Presence of spectacles and contact lenses: Z97.3

## 2021-07-31 LAB — RESP PANEL BY RT-PCR (FLU A&B, COVID) ARPGX2
Influenza A by PCR: NEGATIVE
Influenza B by PCR: NEGATIVE
SARS Coronavirus 2 by RT PCR: NEGATIVE

## 2021-07-31 LAB — POCT PREGNANCY, URINE: Preg Test, Ur: NEGATIVE

## 2021-07-31 SURGERY — SALPINGECTOMY, BILATERAL, LAPAROSCOPIC
Anesthesia: Monitor Anesthesia Care

## 2021-07-31 MED ORDER — SCOPOLAMINE 1 MG/3DAYS TD PT72: 1.0000 | MEDICATED_PATCH | TRANSDERMAL | Status: DC

## 2021-07-31 MED ORDER — LACTATED RINGERS IV SOLN: INTRAVENOUS | Status: DC

## 2021-07-31 MED ORDER — ACETAMINOPHEN 500 MG PO TABS: 1000.0000 mg | ORAL_TABLET | ORAL | Status: DC

## 2021-07-31 MED ORDER — POVIDONE-IODINE 10 % EX SWAB: 2.0000 "application " | Freq: Once | CUTANEOUS | Status: DC

## 2021-07-31 MED ORDER — SCOPOLAMINE 1 MG/3DAYS TD PT72
MEDICATED_PATCH | TRANSDERMAL | Status: AC
  Filled 2021-07-31: qty 1

## 2021-07-31 MED ORDER — ACETAMINOPHEN 500 MG PO TABS
ORAL_TABLET | ORAL | Status: AC
  Filled 2021-07-31: qty 2

## 2021-07-31 SURGICAL SUPPLY — 15 items
BAG RETRIEVAL 10 (BASKET)
CABLE HIGH FREQUENCY MONO STRZ (ELECTRODE) IMPLANT
CATH ROBINSON RED A/P 16FR (CATHETERS) IMPLANT
DRSG OPSITE POSTOP 3X4 (GAUZE/BANDAGES/DRESSINGS) IMPLANT
SET SUCTION IRRIG HYDROSURG (IRRIGATION / IRRIGATOR) IMPLANT
SHEARS HARMONIC ACE PLUS 36CM (ENDOMECHANICALS) IMPLANT
SLEEVE XCEL OPT CAN 5 100 (ENDOMECHANICALS) ×1 IMPLANT
SOLUTION ELECTROLUBE (MISCELLANEOUS) IMPLANT
SUT VIC AB 3-0 PS2 18 (SUTURE)
SUT VIC AB 3-0 PS2 18XBRD (SUTURE) ×1 IMPLANT
SUT VICRYL 0 UR6 27IN ABS (SUTURE) IMPLANT
SYS BAG RETRIEVAL 10MM (BASKET)
SYSTEM BAG RETRIEVAL 10MM (BASKET) IMPLANT
SYSTEM CARTER THOMASON II (TROCAR) IMPLANT
TROCAR XCEL NON-BLD 11X100MML (ENDOMECHANICALS) IMPLANT

## 2021-07-31 NOTE — Progress Notes (Signed)
Patient entered pre-op area with cough, sneezing, and sore throat. RN notified Dr. Mindi Slicker of patients symptoms and gave verbal order for patient to be swabbed for COVID and flu. Dr. Mindi Slicker to be notified once resulted  ?

## 2021-08-22 NOTE — H&P (Signed)
[name] is an 50 [id number] female here for laparoscopic bilateral tubal ligation. Pt has completed her family and desires permanent sterilization  ? ?Pertinent Gynecological History: ?Menses:  regular monthly ?Bleeding: moderate ?Contraception: none ?DES exposure: denies ?Blood transfusions: none ?Sexually transmitted diseases: CT  ?Previous GYN Procedures:  n/a   ?Last mammogram:  n/a  Date:   ?Last pap: normal Date: 09/2020 ?OB History: G9, P7027  ? ?Menstrual History: ?Menarche age: 75 ?No LMP recorded. ?  ? ?Past Medical History:  ?Diagnosis Date  ? ADHD (attention deficit hyperactivity disorder)   ? History of chlamydia   ? History of COVID-19 11/2020  ? per pt mild symptoms that resolved  ? Ruptured intervertebral disc   ? S1,  per pt with chronic pain  ? Wears glasses   ? ? ?Past Surgical History:  ?Procedure Laterality Date  ? BREAST SURGERY Left 05/13/2005  ? @[facility] ;   excision fibroadenoma  ? CHOLECYSTECTOMY, LAPAROSCOPIC  11/17/2007  ? @[facility]   ? ? ?Family History  ?Problem Relation Age of Onset  ? Diabetes Maternal Grandmother   ? Hypertension Maternal Grandmother   ? Diabetes Paternal Grandmother   ? Hypertension Paternal Grandmother   ? Diabetes Father   ? Cancer Paternal Aunt   ? Cancer Mother   ?     cervical cancer  ? ? ?Social History:  reports that she has been smoking cigarettes. She has a 9.00 pack-year smoking history. She has never used smokeless tobacco. She reports that she does not drink alcohol and does not use drugs. ? ?Allergies: No Known Allergies ? ?No medications prior to admission.  ? ? ?Review of Systems  ?Constitutional:  Negative for activity change and fatigue.  ?Eyes:  Negative for photophobia, redness and visual disturbance.  ?Respiratory:  Negative for chest tightness and shortness of breath.   ?Cardiovascular:  Negative for chest pain, palpitations and leg swelling.  ?Gastrointestinal:  Negative for abdominal pain.  ?Genitourinary:  Negative for vaginal bleeding and  vaginal discharge.  ?Musculoskeletal:  Negative for back pain.  ?Neurological:  Negative for light-headedness, numbness and headaches.  ?Psychiatric/Behavioral:  The patient is not nervous/anxious.   ? ?not currently breastfeeding. ?Physical Exam ?Constitutional:   ?   Appearance: Normal appearance. She is normal weight.  ?Cardiovascular:  ?   Rate and Rhythm: Normal rate.  ?Pulmonary:  ?   Effort: Pulmonary effort is normal.  ?Abdominal:  ?   General: Abdomen is flat.  ?   Palpations: Abdomen is soft.  ?Musculoskeletal:     ?   General: Normal range of motion.  ?   Cervical back: Normal range of motion.  ?Skin: ?   General: Skin is warm and dry.  ?   Capillary Refill: Capillary refill takes 2 to 3 seconds.  ?Neurological:  ?   General: No focal deficit present.  ?   Mental Status: She is alert and oriented to person, place, and time. Mental status is at baseline.  ?Psychiatric:     ?   Mood and Affect: Mood normal.     ?   Behavior: Behavior normal.     ?   Thought Content: Thought content normal.     ?   Judgment: Judgment normal.  ? ? ?No results found for this or any previous visit (from the past 24 hour(s)). ? ?No results found. ? ?Assessment/Plan: ?35yo [phone] female here for laparoscopic bilateral tubal ligation - complete family ?- Admit ?- ERAS protocol ?- Verify consent  ?-  To OR when ready  ? ?Cathrine Muster ?08/22/2021, 7:01 PM ? ?

## 2021-09-08 NOTE — Progress Notes (Addendum)
Pt returned phone call made for pre-op interview for surgery scheduled for 09-11-2021 by Dr Terri Piedra @[facility] .  Pt stated she is pregnant.  Pt advised she needs to call Dr Terri Piedra office and let them know. Called and left message w/ Jarrett Soho, [place] scheduler for Dr Terri Piedra to inform her pt stated she is pregnant.

## 2021-09-09 DIAGNOSIS — O039 Complete or unspecified spontaneous abortion without complication: Secondary | ICD-10-CM

## 2021-09-09 HISTORY — DX: Complete or unspecified spontaneous abortion without complication: O03.9

## 2021-09-10 ENCOUNTER — Encounter (HOSPITAL_BASED_OUTPATIENT_CLINIC_OR_DEPARTMENT_OTHER): Payer: Self-pay | Admitting: Obstetrics and Gynecology

## 2021-09-10 NOTE — Progress Notes (Addendum)
Spoke with [name], pt is to remain on schedule and pt is having miscarriage and  hcg   level being stat done today 09-10-2021 and hcg levels were done 09-09-2021  in office, hannah to fax results to [phone] when lab results available.  Addendum:  Spoke with dee at dr banga office triage nurse to fax hcg levels done 09-09-2021 and 09-10-2021 to [facility] main fax [phone]  when labs are back for 09-11-2021 surgery. Spoke with tish in pre op and pre op will check fax for heg results.  Hcg levels from 09-09-2021 and 09-10-2021 received via fax from dr banga office and placed on pt chart

## 2021-09-11 ENCOUNTER — Encounter (HOSPITAL_BASED_OUTPATIENT_CLINIC_OR_DEPARTMENT_OTHER)
Admission: RE | Disposition: A | Discharge status: Home / Self Care | Payer: Self-pay | Source: Home / Self Care | Attending: Obstetrics and Gynecology

## 2021-09-11 ENCOUNTER — Encounter (HOSPITAL_BASED_OUTPATIENT_CLINIC_OR_DEPARTMENT_OTHER): Payer: Self-pay | Admitting: Obstetrics and Gynecology

## 2021-09-11 ENCOUNTER — Ambulatory Visit (HOSPITAL_BASED_OUTPATIENT_CLINIC_OR_DEPARTMENT_OTHER): Payer: Medicaid Other | Admitting: Certified Registered Nurse Anesthetist

## 2021-09-11 ENCOUNTER — Other Ambulatory Visit: Payer: Self-pay

## 2021-09-11 ENCOUNTER — Ambulatory Visit (HOSPITAL_BASED_OUTPATIENT_CLINIC_OR_DEPARTMENT_OTHER)
Admission: RE | Admit: 2021-09-11 | Discharge: 2021-09-11 | Disposition: A | Discharge status: Home / Self Care | Payer: Medicaid Other | Attending: Obstetrics and Gynecology | Admitting: Obstetrics and Gynecology

## 2021-09-11 DIAGNOSIS — Z302 Encounter for sterilization: Secondary | ICD-10-CM | POA: Insufficient documentation

## 2021-09-11 DIAGNOSIS — Z01818 Encounter for other preprocedural examination: Secondary | ICD-10-CM

## 2021-09-11 DIAGNOSIS — F17210 Nicotine dependence, cigarettes, uncomplicated: Secondary | ICD-10-CM | POA: Diagnosis not present

## 2021-09-11 HISTORY — PX: LAPAROSCOPIC BILATERAL SALPINGECTOMY: SHX5889

## 2021-09-11 LAB — CBC
HCT: 40 % (ref 36.0–46.0)
Hemoglobin: 13.4 g/dL (ref 12.0–15.0)
MCH: 31.4 pg (ref 26.0–34.0)
MCHC: 33.5 g/dL (ref 30.0–36.0)
MCV: 93.7 fL (ref 80.0–100.0)
Platelets: 358 10*3/uL (ref 150–400)
RBC: 4.27 MIL/uL (ref 3.87–5.11)
RDW: 13.4 % (ref 11.5–15.5)
WBC: 9.6 10*3/uL (ref 4.0–10.5)
nRBC: 0 % (ref 0.0–0.2)

## 2021-09-11 LAB — POCT PREGNANCY, URINE: Preg Test, Ur: NEGATIVE

## 2021-09-11 SURGERY — SALPINGECTOMY, BILATERAL, LAPAROSCOPIC
Anesthesia: General | Laterality: Bilateral

## 2021-09-11 MED ORDER — ONDANSETRON HCL 4 MG/2ML IJ SOLN
INTRAMUSCULAR | Status: DC | PRN
  Administered 2021-09-11: 4 mg via INTRAVENOUS

## 2021-09-11 MED ORDER — MIDAZOLAM HCL 2 MG/2ML IJ SOLN
INTRAMUSCULAR | Status: AC
  Filled 2021-09-11: qty 2

## 2021-09-11 MED ORDER — ACETAMINOPHEN 500 MG PO TABS
1000.0000 mg | ORAL_TABLET | ORAL | Status: AC
  Administered 2021-09-11: 1000 mg via ORAL

## 2021-09-11 MED ORDER — IBUPROFEN 800 MG PO TABS: 800.0000 mg | ORAL_TABLET | Freq: Three times a day (TID) | ORAL | 1 refills | Status: DC | PRN

## 2021-09-11 MED ORDER — KETOROLAC TROMETHAMINE 30 MG/ML IJ SOLN: 30.0000 mg | Freq: Once | INTRAMUSCULAR | Status: DC

## 2021-09-11 MED ORDER — KETOROLAC TROMETHAMINE 30 MG/ML IJ SOLN
INTRAMUSCULAR | Status: DC | PRN
  Administered 2021-09-11: 30 mg via INTRAVENOUS

## 2021-09-11 MED ORDER — POVIDONE-IODINE 10 % EX SWAB: 2.0000 "application " | Freq: Once | CUTANEOUS | Status: DC

## 2021-09-11 MED ORDER — LACTATED RINGERS IV SOLN: INTRAVENOUS | Status: DC

## 2021-09-11 MED ORDER — OXYCODONE-ACETAMINOPHEN 5-325 MG PO TABS: 1.0000 | ORAL_TABLET | ORAL | 0 refills | Status: AC | PRN

## 2021-09-11 MED ORDER — FENTANYL CITRATE (PF) 250 MCG/5ML IJ SOLN
INTRAMUSCULAR | Status: DC | PRN
  Administered 2021-09-11: 25 ug via INTRAVENOUS
  Administered 2021-09-11: 50 ug via INTRAVENOUS
  Administered 2021-09-11: 25 ug via INTRAVENOUS
  Administered 2021-09-11: 50 ug via INTRAVENOUS

## 2021-09-11 MED ORDER — PROPOFOL 10 MG/ML IV BOLUS
INTRAVENOUS | Status: AC
  Filled 2021-09-11: qty 20

## 2021-09-11 MED ORDER — DEXAMETHASONE SODIUM PHOSPHATE 10 MG/ML IJ SOLN
INTRAMUSCULAR | Status: DC | PRN
  Administered 2021-09-11: 10 mg via INTRAVENOUS

## 2021-09-11 MED ORDER — PROPOFOL 1000 MG/100ML IV EMUL
INTRAVENOUS | Status: AC
  Filled 2021-09-11: qty 100

## 2021-09-11 MED ORDER — ACETAMINOPHEN 500 MG PO TABS
ORAL_TABLET | ORAL | Status: AC
  Filled 2021-09-11: qty 2

## 2021-09-11 MED ORDER — ROCURONIUM BROMIDE 10 MG/ML (PF) SYRINGE
PREFILLED_SYRINGE | INTRAVENOUS | Status: DC | PRN
  Administered 2021-09-11: 60 mg via INTRAVENOUS

## 2021-09-11 MED ORDER — OXYCODONE-ACETAMINOPHEN 5-325 MG PO TABS: 1.0000 | ORAL_TABLET | ORAL | 0 refills | Status: DC | PRN

## 2021-09-11 MED ORDER — PROPOFOL 10 MG/ML IV BOLUS
INTRAVENOUS | Status: DC | PRN
  Administered 2021-09-11: 150 mg via INTRAVENOUS

## 2021-09-11 MED ORDER — MIDAZOLAM HCL 2 MG/2ML IJ SOLN
INTRAMUSCULAR | Status: DC | PRN
  Administered 2021-09-11: 2 mg via INTRAVENOUS

## 2021-09-11 MED ORDER — SUGAMMADEX SODIUM 200 MG/2ML IV SOLN
INTRAVENOUS | Status: DC | PRN
  Administered 2021-09-11: 300 mg via INTRAVENOUS

## 2021-09-11 MED ORDER — 0.9 % SODIUM CHLORIDE (POUR BTL) OPTIME
TOPICAL | Status: DC | PRN
  Administered 2021-09-11: 500 mL

## 2021-09-11 MED ORDER — HYDROMORPHONE HCL 1 MG/ML IJ SOLN: 0.2500 mg | INTRAMUSCULAR | Status: DC | PRN

## 2021-09-11 MED ORDER — DEXMEDETOMIDINE (PRECEDEX) IN NS 20 MCG/5ML (4 MCG/ML) IV SYRINGE
PREFILLED_SYRINGE | INTRAVENOUS | Status: DC | PRN
  Administered 2021-09-11 (×3): 4 ug via INTRAVENOUS

## 2021-09-11 MED ORDER — OXYCODONE-ACETAMINOPHEN 5-325 MG PO TABS
1.0000 | ORAL_TABLET | ORAL | 0 refills | Status: DC | PRN
Start: 2021-09-11 — End: 2021-09-11

## 2021-09-11 MED ORDER — BUPIVACAINE HCL (PF) 0.25 % IJ SOLN
INTRAMUSCULAR | Status: DC | PRN
  Administered 2021-09-11: 16 mL

## 2021-09-11 MED ORDER — LIDOCAINE 2% (20 MG/ML) 5 ML SYRINGE
INTRAMUSCULAR | Status: DC | PRN
  Administered 2021-09-11: 100 mg via INTRAVENOUS

## 2021-09-11 MED ORDER — FENTANYL CITRATE (PF) 100 MCG/2ML IJ SOLN
INTRAMUSCULAR | Status: AC
  Filled 2021-09-11: qty 2

## 2021-09-11 SURGICAL SUPPLY — 35 items
ADH SKN CLS APL DERMABOND .7 (GAUZE/BANDAGES/DRESSINGS) ×1
BAG RETRIEVAL 10 (BASKET)
CABLE HIGH FREQUENCY MONO STRZ (ELECTRODE) IMPLANT
CATH ROBINSON RED A/P 16FR (CATHETERS) IMPLANT
COVER MAYO STAND STRL (DRAPES) ×2 IMPLANT
DECANTER SPIKE VIAL GLASS SM (MISCELLANEOUS) ×2 IMPLANT
DERMABOND ADVANCED (GAUZE/BANDAGES/DRESSINGS) ×1
DERMABOND ADVANCED .7 DNX12 (GAUZE/BANDAGES/DRESSINGS) ×1 IMPLANT
DRSG OPSITE POSTOP 3X4 (GAUZE/BANDAGES/DRESSINGS) IMPLANT
DURAPREP 26ML APPLICATOR (WOUND CARE) ×2 IMPLANT
GAUZE 4X4 16PLY ~~LOC~~+RFID DBL (SPONGE) ×4 IMPLANT
GLOVE BIO SURGEON STRL SZ 6.5 (GLOVE) ×4 IMPLANT
GOWN STRL REUS W/TWL LRG LVL3 (GOWN DISPOSABLE) ×6 IMPLANT
KIT TURNOVER CYSTO (KITS) ×2 IMPLANT
MANIPULATOR UTERINE 7CM CLEARV (MISCELLANEOUS) ×2 IMPLANT
NS IRRIG 1000ML POUR BTL (IV SOLUTION) ×2 IMPLANT
PACK LAPAROSCOPY BASIN (CUSTOM PROCEDURE TRAY) ×2 IMPLANT
PACK TRENDGUARD 450 HYBRID PRO (MISCELLANEOUS) ×1 IMPLANT
PROTECTOR NERVE ULNAR (MISCELLANEOUS) ×4 IMPLANT
SET SUCTION IRRIG HYDROSURG (IRRIGATION / IRRIGATOR) IMPLANT
SET TUBE SMOKE EVAC HIGH FLOW (TUBING) ×2 IMPLANT
SHEARS HARMONIC ACE PLUS 36CM (ENDOMECHANICALS) IMPLANT
SLEEVE XCEL OPT CAN 5 100 (ENDOMECHANICALS) ×2 IMPLANT
SOLUTION ELECTROLUBE (MISCELLANEOUS) IMPLANT
SUT VIC AB 3-0 PS2 18 (SUTURE) ×2
SUT VIC AB 3-0 PS2 18XBRD (SUTURE) ×1 IMPLANT
SUT VICRYL 0 UR6 27IN ABS (SUTURE) IMPLANT
SYS BAG RETRIEVAL 10MM (BASKET)
SYSTEM BAG RETRIEVAL 10MM (BASKET) IMPLANT
SYSTEM CARTER THOMASON II (TROCAR) IMPLANT
TOWEL OR 17X26 10 PK STRL BLUE (TOWEL DISPOSABLE) ×4 IMPLANT
TRENDGUARD 450 HYBRID PRO PACK (MISCELLANEOUS) ×2
TROCAR BLADELESS OPT 5 100 (ENDOMECHANICALS) ×4 IMPLANT
TROCAR XCEL NON-BLD 11X100MML (ENDOMECHANICALS) IMPLANT
WARMER LAPAROSCOPE (MISCELLANEOUS) ×2 IMPLANT

## 2021-09-11 NOTE — Discharge Instructions (Addendum)
Call office with any concerns [phone]  Post Anesthesia Home Care Instructions  Activity: Get plenty of rest for the remainder of the day. A responsible adult should stay with you for 24 hours following the procedure.  For the next 24 hours, DO NOT: -Drive a car -Advertising copywriter -Drink alcoholic beverages -Take any medication unless instructed by your physician -Make any legal decisions or sign important papers.  Meals: Start with liquid foods such as gelatin or soup. Progress to regular foods as tolerated. Avoid greasy, spicy, heavy foods. If nausea and/or vomiting occur, drink only clear liquids until the nausea and/or vomiting subsides. Call your physician if vomiting continues.  Special Instructions/Symptoms: Your throat may feel dry or sore from the anesthesia or the breathing tube placed in your throat during surgery. If this causes discomfort, gargle with warm salt water. The discomfort should disappear within 24 hours.      Post Anesthesia Home Care Instructions  Activity: Get plenty of rest for the remainder of the day. A responsible adult should stay with you for 24 hours following the procedure.  For the next 24 hours, DO NOT: -Drive a car -Advertising copywriter -Drink alcoholic beverages -Take any medication unless instructed by your physician -Make any legal decisions or sign important papers.  Meals: Start with liquid foods such as gelatin or soup. Progress to regular foods as tolerated. Avoid greasy, spicy, heavy foods. If nausea and/or vomiting occur, drink only clear liquids until the nausea and/or vomiting subsides. Call your physician if vomiting continues.  Special Instructions/Symptoms: Your throat may feel dry or sore from the anesthesia or the breathing tube placed in your throat during surgery. If this causes discomfort, gargle with warm salt water. The discomfort should disappear within 24 hours.

## 2021-09-11 NOTE — Transfer of Care (Signed)
Immediate Anesthesia Transfer of Care Note  Patient: [name]  Procedure(s) Performed: LAPAROSCOPIC BILATERAL SALPINGECTOMY (Bilateral)  Patient Location: PACU  Anesthesia Type:General  Level of Consciousness: awake, alert  and oriented  Airway & Oxygen Therapy: Patient Spontanous Breathing  Post-op Assessment: Report given to RN and Post -op Vital signs reviewed and stable  Post vital signs: Reviewed and stable  Last Vitals:  Vitals Value Taken Time  BP 103/59 09/11/21 0949  Temp    Pulse 83 09/11/21 0952  Resp 23 09/11/21 0952  SpO2 93 % 09/11/21 0952  Vitals shown include unvalidated device data.  Last Pain:  Vitals:   09/11/21 0706  TempSrc: Oral  PainSc: 3       Patients Stated Pain Goal: 2 (09/11/21 0706)  Complications: No notable events documented.

## 2021-09-11 NOTE — Anesthesia Procedure Notes (Signed)
Procedure Name: Intubation Date/Time: 09/11/2021 8:52 AM Performed by: Clearnce Sorrel, CRNA Pre-anesthesia Checklist: Patient identified, Emergency Drugs available, Suction available and Patient being monitored Patient Re-evaluated:Patient Re-evaluated prior to induction Oxygen Delivery Method: Circle System Utilized Preoxygenation: Pre-oxygenation with 100% oxygen Induction Type: IV induction Ventilation: Mask ventilation without difficulty Laryngoscope Size: Mac and 3 Grade View: Grade I Tube type: Oral Tube size: 7.0 mm Number of attempts: 1 Airway Equipment and Method: Stylet and Oral airway Placement Confirmation: ETT inserted through vocal cords under direct vision, positive ETCO2 and breath sounds checked- equal and bilateral Secured at: 23 cm Tube secured with: Tape Dental Injury: Teeth and Oropharynx as per pre-operative assessment

## 2021-09-11 NOTE — Anesthesia Postprocedure Evaluation (Signed)
Anesthesia Post Note  Patient: [name]  Procedure(s) Performed: LAPAROSCOPIC BILATERAL SALPINGECTOMY (Bilateral)     Patient location during evaluation: PACU Anesthesia Type: General Level of consciousness: awake Pain management: pain level controlled Vital Signs Assessment: post-procedure vital signs reviewed and stable Cardiovascular status: stable Postop Assessment: no apparent nausea or vomiting Anesthetic complications: no   No notable events documented.  Last Vitals:  Vitals:   09/11/21 1039 09/11/21 1104  BP:    Pulse: 68 (!) 57  Resp: (!) 21 19  Temp: 36.7 C   SpO2: 97% 98%    Last Pain:  Vitals:   09/11/21 1039  TempSrc: Oral  PainSc:                  Breonna Gafford

## 2021-09-11 NOTE — Op Note (Signed)
Operative Note    Preoperative Diagnosis: 1. Complete family   Postoperative Diagnosis: Same   Procedure: Laparoscopic bilateral tubal excision   Surgeon: Britt Bottom, DO Assist: Lorenza Cambridge RNFA  Anesthesia: general   Fluids: LR 1L EBL: <58ml UOP:   Findings: Grossly normal uterus, tubes and ovaries   Specimen: Bilateral fallopian tubes to pathology   Procedure Note  Patient was taken to the operating room where general anesthesia was administered without difficulty. She was then prepped and draped in the normal sterile fashion in the dorsal lithotomy position. An appropriate timeout was performed.  A speculum was placed in the vagina and a ClearVue manipulator placed for uterine manipulation. The bladder was emptied.  Attention was then turned to the patient's abdomen after draping. 5cc of 0.25% marcaine plain was injected. An incision was made in the infraumbilical area and a  28mm optiview scope placed,  Gas flow was then applied and a pneumoperitoneum obtained with approximate 3 L of CO2 gas.  With patient in Trendelenburg the uterus and tubes and ovaries were inspected and noted to be grossly normal. A second and third 73mm trocars were placed under direct visualization the left and right ( respectfully) lower quadrants.   Both fallopian tubes were easily identified and followed to their fimbriated ends. No gross abnormalities noted.  A harmonic scalpel was then introduced and the fallopian tubes excised to within 1.5cm from the cornua. Hemostasis was appreciated. The remainder of the pelvis and abdomen were inspected with no injuries or bleeding noted.  The patient was then flattened and all instruments were removed from the abdomen. Pneumoperitoneum was reduced through the operative trocar. The umbilical trocar was finally removed after anesthesia applied deep breaths to effect further reduction of gas.  Next the incisions were closed using 4-0 Vicryl and dermabond.   The clearvue manipulator was removed- hemostasis noted.  Patient was then awakened and taken to the recovery room in stable condition. Counts were correct per nursing x 2

## 2021-09-11 NOTE — Anesthesia Preprocedure Evaluation (Signed)
Anesthesia Evaluation  Patient identified by MRN, date of birth, ID band Patient awake    Reviewed: Allergy & Precautions  Airway Mallampati: II  TM Distance: >3 FB     Dental   Pulmonary Current Smoker and Patient abstained from smoking.,    breath sounds clear to auscultation       Cardiovascular negative cardio ROS   Rhythm:Regular Rate:Normal     Neuro/Psych PSYCHIATRIC DISORDERS    GI/Hepatic negative GI ROS, Neg liver ROS,   Endo/Other  negative endocrine ROS  Renal/GU negative Renal ROS     Musculoskeletal   Abdominal   Peds  Hematology negative hematology ROS (+)   Anesthesia Other Findings   Reproductive/Obstetrics                             Anesthesia Physical Anesthesia Plan  ASA: 1  Anesthesia Plan: General   Post-op Pain Management:    Induction: Intravenous  PONV Risk Score and Plan: 2 and Ondansetron, Dexamethasone and Midazolam  Airway Management Planned: Oral ETT  Additional Equipment:   Intra-op Plan:   Post-operative Plan:   Informed Consent: I have reviewed the patients History and Physical, chart, labs and discussed the procedure including the risks, benefits and alternatives for the proposed anesthesia with the patient or authorized representative who has indicated his/her understanding and acceptance.     Dental advisory given  Plan Discussed with: CRNA and Anesthesiologist  Anesthesia Plan Comments:         Anesthesia Quick Evaluation

## 2021-09-11 NOTE — Interval H&P Note (Signed)
History and Physical Interval Note: Pt seen. Since H/P done pt had a +upt.  Pt then begun to have spontaneous heavy bleeding . Bloodwork confirmed quant HCG of 28.on 09/09/21 and on repeat a day later, quant HCG noted to have dropped thus confirming miscarriage Pt deemed appropriate to proceed with surgery as planned She feels well today and is still determined for procedure.  Risks/benefits confirmed and consent obtained on paper To OR when ready   7:24 AM  [name]  has presented today for surgery, with the diagnosis of sterilization.  The various methods of treatment have been discussed with the patient and family. After consideration of risks, benefits and other options for treatment, the patient has consented to  Procedure(s): LAPAROSCOPIC BILATERAL SALPINGECTOMY (Bilateral) as a surgical intervention.  The patient's history has been reviewed, patient examined, no change in status, stable for surgery.  I have reviewed the patient's chart and labs.  Questions were answered to the patient's satisfaction.     Cathrine Muster

## 2021-09-14 ENCOUNTER — Encounter (HOSPITAL_BASED_OUTPATIENT_CLINIC_OR_DEPARTMENT_OTHER): Payer: Self-pay | Admitting: Obstetrics and Gynecology

## 2021-09-14 LAB — SURGICAL PATHOLOGY

## 2021-09-14 LAB — TYPE AND SCREEN
ABO/RH(D): O POS
Antibody Screen: NEGATIVE

## 2022-05-24 DIAGNOSIS — R102 Pelvic and perineal pain: Secondary | ICD-10-CM | POA: Diagnosis not present

## 2022-08-27 ENCOUNTER — Ambulatory Visit
Admission: EM | Admit: 2022-08-27 | Discharge: 2022-08-27 | Disposition: A | Discharge status: Home / Self Care | Payer: Medicaid Other

## 2022-08-27 DIAGNOSIS — L550 Sunburn of first degree: Secondary | ICD-10-CM
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
## 2022-08-27 NOTE — Discharge Instructions (Signed)
Continue aloe vera to her sunburn as needed.  Remain hydrated.  Please go to the ER if you develop any worsening symptoms

## 2022-09-08 ENCOUNTER — Other Ambulatory Visit: Payer: Self-pay

## 2022-09-08 ENCOUNTER — Encounter (HOSPITAL_BASED_OUTPATIENT_CLINIC_OR_DEPARTMENT_OTHER): Payer: Self-pay | Admitting: Obstetrics and Gynecology

## 2022-09-08 NOTE — Progress Notes (Addendum)
Spoke w/ via phone for pre-op interview---pt Lab needs dos----   urine preg poct  , surgery orders requested dr Mindi Slicker epic ib on 09-07-2022          Lab results------none COVID test -----patient states asymptomatic no test needed Arrive at -------800 am 09-23-2022 NPO after MN NO Solid Food.  Clear liquids from MN until---700 am Med rec completed Medications to take morning of surgery -----wellbutrin, hydroxyazine Diabetic medication -----n/a Patient instructed no nail polish to be worn day of surgery Patient instructed to bring photo id and insurance card day of surgery Patient aware to have Driver (ride ) / caregiver   pt aware will arrange driver /caregiver  for 24 hours after surgery  Patient Special Instructions -----none Pre-Op special Instructions -----none Patient verbalized understanding of instructions that were given at this phone interview. Patient denies shortness of breath, chest pain, fever, cough at this phone interview.

## 2022-09-23 ENCOUNTER — Other Ambulatory Visit: Payer: Self-pay

## 2022-09-23 ENCOUNTER — Ambulatory Visit (HOSPITAL_BASED_OUTPATIENT_CLINIC_OR_DEPARTMENT_OTHER): Payer: Medicaid Other | Admitting: Anesthesiology

## 2022-09-23 ENCOUNTER — Ambulatory Visit (HOSPITAL_BASED_OUTPATIENT_CLINIC_OR_DEPARTMENT_OTHER)
Admission: RE | Admit: 2022-09-23 | Discharge: 2022-09-23 | Disposition: A | Discharge status: Home / Self Care | Payer: Medicaid Other | Attending: Obstetrics and Gynecology | Admitting: Obstetrics and Gynecology

## 2022-09-23 ENCOUNTER — Encounter (HOSPITAL_BASED_OUTPATIENT_CLINIC_OR_DEPARTMENT_OTHER)
Admission: RE | Disposition: A | Discharge status: Home / Self Care | Payer: Self-pay | Source: Home / Self Care | Attending: Obstetrics and Gynecology

## 2022-09-23 ENCOUNTER — Encounter (HOSPITAL_BASED_OUTPATIENT_CLINIC_OR_DEPARTMENT_OTHER): Payer: Self-pay | Admitting: Obstetrics and Gynecology

## 2022-09-23 DIAGNOSIS — F909 Attention-deficit hyperactivity disorder, unspecified type: Secondary | ICD-10-CM | POA: Insufficient documentation

## 2022-09-23 DIAGNOSIS — Z9851 Tubal ligation status: Secondary | ICD-10-CM | POA: Insufficient documentation

## 2022-09-23 DIAGNOSIS — N926 Irregular menstruation, unspecified: Secondary | ICD-10-CM

## 2022-09-23 DIAGNOSIS — F17210 Nicotine dependence, cigarettes, uncomplicated: Secondary | ICD-10-CM

## 2022-09-23 DIAGNOSIS — N938 Other specified abnormal uterine and vaginal bleeding: Secondary | ICD-10-CM | POA: Insufficient documentation

## 2022-09-23 DIAGNOSIS — Z01818 Encounter for other preprocedural examination: Secondary | ICD-10-CM

## 2022-09-23 HISTORY — DX: Irregular menstruation, unspecified: N92.6

## 2022-09-23 HISTORY — PX: DILITATION & CURRETTAGE/HYSTROSCOPY WITH NOVASURE ABLATION: SHX5568

## 2022-09-23 LAB — TYPE AND SCREEN
ABO/RH(D): O POS
Antibody Screen: NEGATIVE

## 2022-09-23 LAB — CBC
HCT: 42.6 % (ref 36.0–46.0)
Hemoglobin: 14.4 g/dL (ref 12.0–15.0)
MCH: 31.5 pg (ref 26.0–34.0)
MCHC: 33.8 g/dL (ref 30.0–36.0)
MCV: 93.2 fL (ref 80.0–100.0)
Platelets: 342 10*3/uL (ref 150–400)
RBC: 4.57 MIL/uL (ref 3.87–5.11)
RDW: 13.7 % (ref 11.5–15.5)
WBC: 9 10*3/uL (ref 4.0–10.5)
nRBC: 0 % (ref 0.0–0.2)

## 2022-09-23 LAB — POCT PREGNANCY, URINE: Preg Test, Ur: NEGATIVE

## 2022-09-23 SURGERY — DILATATION & CURETTAGE/HYSTEROSCOPY WITH NOVASURE ABLATION
Anesthesia: General | Site: Uterus

## 2022-09-23 MED ORDER — DEXMEDETOMIDINE HCL IN NACL 80 MCG/20ML IV SOLN
INTRAVENOUS | Status: DC | PRN
  Administered 2022-09-23 (×3): 4 ug via INTRAVENOUS

## 2022-09-23 MED ORDER — KETOROLAC TROMETHAMINE 30 MG/ML IJ SOLN: 30.0000 mg | Freq: Once | INTRAMUSCULAR | Status: DC

## 2022-09-23 MED ORDER — BUPIVACAINE-EPINEPHRINE 0.25% -1:200000 IJ SOLN: INTRAMUSCULAR | Status: DC | PRN

## 2022-09-23 MED ORDER — LIDOCAINE HCL (PF) 2 % IJ SOLN
INTRAMUSCULAR | Status: AC
  Filled 2022-09-23: qty 5

## 2022-09-23 MED ORDER — OXYCODONE-ACETAMINOPHEN 5-325 MG PO TABS: 1.0000 | ORAL_TABLET | ORAL | 0 refills | Status: AC | PRN

## 2022-09-23 MED ORDER — ACETAMINOPHEN 500 MG PO TABS
ORAL_TABLET | ORAL | Status: AC
  Filled 2022-09-23: qty 2

## 2022-09-23 MED ORDER — OXYCODONE HCL 5 MG/5ML PO SOLN: 5.0000 mg | Freq: Once | ORAL | Status: DC | PRN

## 2022-09-23 MED ORDER — DEXAMETHASONE SODIUM PHOSPHATE 10 MG/ML IJ SOLN
INTRAMUSCULAR | Status: DC | PRN
  Administered 2022-09-23: 10 mg via INTRAVENOUS

## 2022-09-23 MED ORDER — ONDANSETRON HCL 4 MG/2ML IJ SOLN
INTRAMUSCULAR | Status: DC | PRN
  Administered 2022-09-23: 4 mg via INTRAVENOUS

## 2022-09-23 MED ORDER — IBUPROFEN 600 MG PO TABS: 600.0000 mg | ORAL_TABLET | Freq: Four times a day (QID) | ORAL | 0 refills | Status: AC | PRN

## 2022-09-23 MED ORDER — MIDAZOLAM HCL 2 MG/2ML IJ SOLN
INTRAMUSCULAR | Status: AC
  Filled 2022-09-23: qty 2

## 2022-09-23 MED ORDER — POVIDONE-IODINE 10 % EX SWAB: 2.0000 | Freq: Once | CUTANEOUS | Status: DC

## 2022-09-23 MED ORDER — LACTATED RINGERS IV SOLN: INTRAVENOUS | Status: DC

## 2022-09-23 MED ORDER — OXYCODONE HCL 5 MG PO TABS: 5.0000 mg | ORAL_TABLET | Freq: Once | ORAL | Status: DC | PRN

## 2022-09-23 MED ORDER — DEXAMETHASONE SODIUM PHOSPHATE 10 MG/ML IJ SOLN
INTRAMUSCULAR | Status: AC
  Filled 2022-09-23: qty 1

## 2022-09-23 MED ORDER — ACETAMINOPHEN 160 MG/5ML PO SOLN: 325.0000 mg | ORAL | Status: DC | PRN

## 2022-09-23 MED ORDER — FENTANYL CITRATE (PF) 100 MCG/2ML IJ SOLN
INTRAMUSCULAR | Status: DC | PRN
  Administered 2022-09-23 (×2): 25 ug via INTRAVENOUS
  Administered 2022-09-23: 50 ug via INTRAVENOUS

## 2022-09-23 MED ORDER — FENTANYL CITRATE (PF) 100 MCG/2ML IJ SOLN: 25.0000 ug | INTRAMUSCULAR | Status: DC | PRN

## 2022-09-23 MED ORDER — ACETAMINOPHEN 325 MG PO TABS: 325.0000 mg | ORAL_TABLET | ORAL | Status: DC | PRN

## 2022-09-23 MED ORDER — FENTANYL CITRATE (PF) 100 MCG/2ML IJ SOLN
INTRAMUSCULAR | Status: AC
  Filled 2022-09-23: qty 2

## 2022-09-23 MED ORDER — BUPIVACAINE HCL (PF) 0.25 % IJ SOLN
INTRAMUSCULAR | Status: DC | PRN
  Administered 2022-09-23: 10 mL

## 2022-09-23 MED ORDER — MIDAZOLAM HCL 5 MG/5ML IJ SOLN
INTRAMUSCULAR | Status: DC | PRN
  Administered 2022-09-23: 2 mg via INTRAVENOUS

## 2022-09-23 MED ORDER — PROPOFOL 10 MG/ML IV BOLUS
INTRAVENOUS | Status: AC
  Filled 2022-09-23: qty 20

## 2022-09-23 MED ORDER — LIDOCAINE 2% (20 MG/ML) 5 ML SYRINGE
INTRAMUSCULAR | Status: DC | PRN
  Administered 2022-09-23: 80 mg via INTRAVENOUS

## 2022-09-23 MED ORDER — ONDANSETRON HCL 4 MG/2ML IJ SOLN
INTRAMUSCULAR | Status: AC
  Filled 2022-09-23: qty 2

## 2022-09-23 MED ORDER — ACETAMINOPHEN 500 MG PO TABS
1000.0000 mg | ORAL_TABLET | ORAL | Status: AC
  Administered 2022-09-23: 1000 mg via ORAL

## 2022-09-23 MED ORDER — MEPERIDINE HCL 25 MG/ML IJ SOLN: 6.2500 mg | INTRAMUSCULAR | Status: DC | PRN

## 2022-09-23 MED ORDER — PROPOFOL 10 MG/ML IV BOLUS
INTRAVENOUS | Status: DC | PRN
  Administered 2022-09-23: 180 mg via INTRAVENOUS

## 2022-09-23 MED ORDER — ONDANSETRON HCL 4 MG/2ML IJ SOLN: 4.0000 mg | Freq: Once | INTRAMUSCULAR | Status: DC | PRN

## 2022-09-23 MED ORDER — LACTATED RINGERS IV SOLN
INTRAVENOUS | Status: DC
  Administered 2022-09-23: 1000 mL via INTRAVENOUS

## 2022-09-23 MED ORDER — SODIUM CHLORIDE 0.9 % IR SOLN
Status: DC | PRN
  Administered 2022-09-23: 3000 mL

## 2022-09-23 SURGICAL SUPPLY — 19 items
ABLATOR SURESOUND NOVASURE (ABLATOR) IMPLANT
CATH ROBINSON RED A/P 16FR (CATHETERS) ×1 IMPLANT
DRSG TELFA 3X8 NADH STRL (GAUZE/BANDAGES/DRESSINGS) ×1 IMPLANT
GAUZE 4X4 16PLY ~~LOC~~+RFID DBL (SPONGE) ×2 IMPLANT
GLOVE BIO SURGEON STRL SZ 6.5 (GLOVE) ×1 IMPLANT
GLOVE BIOGEL PI IND STRL 6.5 (GLOVE) IMPLANT
GLOVE BIOGEL PI IND STRL 7.0 (GLOVE) IMPLANT
GOWN STRL REUS W/TWL LRG LVL3 (GOWN DISPOSABLE) ×1 IMPLANT
IV NS IRRIG 3000ML ARTHROMATIC (IV SOLUTION) ×1 IMPLANT
KIT PROCEDURE FLUENT (KITS) ×1 IMPLANT
KIT TURNOVER CYSTO (KITS) ×1 IMPLANT
NDL SPNL 18GX3.5 QUINCKE PK (NEEDLE) IMPLANT
NEEDLE SPNL 18GX3.5 QUINCKE PK (NEEDLE) IMPLANT
PACK VAGINAL MINOR WOMEN LF (CUSTOM PROCEDURE TRAY) ×1 IMPLANT
PAD OB MATERNITY 4.3X12.25 (PERSONAL CARE ITEMS) ×1 IMPLANT
SEAL CERVICAL OMNI LOK (ABLATOR) IMPLANT
SEAL ROD LENS SCOPE MYOSURE (ABLATOR) ×1 IMPLANT
SLEEVE SCD COMPRESS KNEE MED (STOCKING) ×1 IMPLANT
TOWEL OR 17X24 6PK STRL BLUE (TOWEL DISPOSABLE) ×1 IMPLANT

## 2022-09-23 NOTE — Anesthesia Procedure Notes (Signed)
Procedure Name: LMA Insertion Date/Time: 09/23/2022 9:52 AM  Performed by: Kaliyan Osbourn D, CRNAPre-anesthesia Checklist: Patient identified, Emergency Drugs available, Suction available and Patient being monitored Patient Re-evaluated:Patient Re-evaluated prior to induction Oxygen Delivery Method: Circle system utilized Preoxygenation: Pre-oxygenation with 100% oxygen Induction Type: IV induction Ventilation: Mask ventilation without difficulty LMA: LMA inserted LMA Size: 4.0 Tube type: Oral Number of attempts: 1 Placement Confirmation: positive ETCO2 and breath sounds checked- equal and bilateral Tube secured with: Tape Dental Injury: Teeth and Oropharynx as per pre-operative assessment

## 2022-09-23 NOTE — Transfer of Care (Signed)
Immediate Anesthesia Transfer of Care Note  Patient: [name]  Procedure(s) Performed: DILATATION & CURETTAGE/HYSTEROSCOPY WITH NOVASURE ABLATION (Uterus)  Patient Location: PACU  Anesthesia Type:General  Level of Consciousness: awake, alert , and oriented  Airway & Oxygen Therapy: Patient Spontanous Breathing and Patient connected to nasal cannula oxygen  Post-op Assessment: Report given to RN and Post -op Vital signs reviewed and stable  Post vital signs: Reviewed and stable  Last Vitals:  Vitals Value Taken Time  BP 95/67 09/23/22 1020  Temp    Pulse 83 09/23/22 1023  Resp 15 09/23/22 1023  SpO2 96 % 09/23/22 1023  Vitals shown include unvalidated device data.  Last Pain:  Vitals:   09/23/22 0829  TempSrc: Oral  PainSc: 0-No pain      Patients Stated Pain Goal: 7 (09/23/22 0829)  Complications: No notable events documented.

## 2022-09-23 NOTE — Anesthesia Postprocedure Evaluation (Signed)
Anesthesia Post Note  Patient: [name]  Procedure(s) Performed: DILATATION & CURETTAGE/HYSTEROSCOPY WITH NOVASURE ABLATION (Uterus)     Patient location during evaluation: PACU Anesthesia Type: General Level of consciousness: awake and alert Pain management: pain level controlled Vital Signs Assessment: post-procedure vital signs reviewed and stable Respiratory status: spontaneous breathing, nonlabored ventilation, respiratory function stable and patient connected to nasal cannula oxygen Cardiovascular status: blood pressure returned to baseline and stable Postop Assessment: no apparent nausea or vomiting Anesthetic complications: no   No notable events documented.  Last Vitals:  Vitals:   09/23/22 1045 09/23/22 1113  BP: 106/76 125/81  Pulse: 67 (!) 56  Resp: 14 20  Temp: (!) 36.4 C   SpO2: 97% 100%    Last Pain:  Vitals:   09/23/22 1045  TempSrc:   PainSc: 0-No pain                 Cali Hope

## 2022-09-23 NOTE — H&P (Signed)
[name] is an 37 y.o. [phone] female here scheduled for a hysteroscopic novasure ablation with dilation and curettage due to abnormal uterine bleeding. Pt reports passes large clots with menses. ; goes though > 1pad/hr.   She is sexually active. Had BTL/ completed childbearing.   Chronic medical conditions include: ADD , history of ruptured disc.   Pertinent Gynecological History: Menses: with severe dysmenorrhea and and heavy Bleeding: dysfunctional uterine bleeding Contraception: tubal ligation DES exposure: denies Blood transfusions: none Sexually transmitted diseases: no past history Previous GYN Procedures:  BTL    Last mammogram:  n/a  Date:  Last pap: normal Date:  OB History: G9, P7037   Menstrual History: Menarche age: 70 Patient's last menstrual period was 09/09/2022.    Past Medical History:  Diagnosis Date   ADHD (attention deficit hyperactivity disorder)    History of chlamydia    History of COVID-19 11/2020   per pt mild symptoms that resolved   History of COVID-19 10/2020   Irregular periods    Miscarriage 09/09/2021   Ruptured intervertebral disc    S1,  per pt with chronic pain   Wears glasses     Past Surgical History:  Procedure Laterality Date   BREAST SURGERY Left 05/13/2005   @[facility] ;   excision fibroadenoma   CHOLECYSTECTOMY, LAPAROSCOPIC  11/17/2007   @[facility]    LAPAROSCOPIC BILATERAL SALPINGECTOMY Bilateral 09/11/2021   Procedure: LAPAROSCOPIC BILATERAL SALPINGECTOMY;  Surgeon: Edwinna Areola, DO;  Location: [facility];  Service: Gynecology;  Laterality: Bilateral;   TUBAL LIGATION      Family History  Problem Relation Age of Onset   Diabetes Maternal Grandmother    Hypertension Maternal Grandmother    Diabetes Paternal Grandmother    Hypertension Paternal Grandmother    Diabetes Father    Cancer Paternal Aunt    Cancer Mother        cervical cancer    Social History:  reports that she has been smoking  cigarettes. She has a 9.00 pack-year smoking history. She has never used smokeless tobacco. She reports current alcohol use. She reports that she does not use drugs.  Allergies: No Known Allergies  Medications Prior to Admission  Medication Sig Dispense Refill Last Dose   amphetamine-dextroamphetamine (ADDERALL) 10 MG tablet Take 15 mg by mouth 2 (two) times daily with a meal.   09/22/2022   buPROPion (WELLBUTRIN) 75 MG tablet Take 75 mg by mouth 2 (two) times daily.   09/22/2022   hydrOXYzine (VISTARIL) 100 MG capsule Take 100 mg by mouth daily.   09/22/2022    Review of Systems  Constitutional:  Negative for activity change, diaphoresis and fatigue.  Eyes:  Negative for photophobia and visual disturbance.  Respiratory:  Negative for chest tightness and shortness of breath.   Cardiovascular:  Negative for chest pain, palpitations and leg swelling.  Gastrointestinal:  Negative for abdominal pain, nausea and vomiting.  Genitourinary:  Positive for menstrual problem, pelvic pain and vaginal bleeding.  Musculoskeletal:  Negative for back pain.  Neurological:  Negative for headaches.  Psychiatric/Behavioral:  The patient is not nervous/anxious.     Blood pressure 120/82, pulse 71, temperature 98.1 F (36.7 C), temperature source Oral, resp. rate 16, height 5\' 3"  (1.6 m), weight 76.6 kg, last menstrual period 09/09/2022, SpO2 99 %, not currently breastfeeding. Physical Exam Vitals and nursing note reviewed.  Constitutional:      Appearance: Normal appearance. She is normal weight.  Cardiovascular:     Rate and Rhythm: Normal  rate.  Pulmonary:     Effort: Pulmonary effort is normal.  Musculoskeletal:        General: Normal range of motion.     Cervical back: Normal range of motion.  Skin:    General: Skin is warm and dry.     Capillary Refill: Capillary refill takes 2 to 3 seconds.  Neurological:     General: No focal deficit present.     Mental Status: She is alert and oriented to  person, place, and time. Mental status is at baseline.  Psychiatric:        Mood and Affect: Mood normal.        Behavior: Behavior normal.        Thought Content: Thought content normal.        Judgment: Judgment normal.     Results for orders placed or performed during the hospital encounter of 09/23/22 (from the past 24 hour(s))  Pregnancy, urine POC     Status: None   Collection Time: 09/23/22  8:06 AM  Result Value Ref Range   Preg Test, Ur NEGATIVE NEGATIVE  CBC     Status: None   Collection Time: 09/23/22  8:42 AM  Result Value Ref Range   WBC 9.0 4.0 - 10.5 K/uL   RBC 4.57 3.87 - 5.11 MIL/uL   Hemoglobin 14.4 12.0 - 15.0 g/dL   HCT 13.0 86.5 - 78.4 %   MCV 93.2 80.0 - 100.0 fL   MCH 31.5 26.0 - 34.0 pg   MCHC 33.8 30.0 - 36.0 g/dL   RDW 69.6 29.5 - 28.4 %   Platelets 342 150 - 400 K/uL   nRBC 0.0 0.0 - 0.2 %    No results found.  Assessment/Plan: 15KG [id number] female with DUB here for hysteroscopy with dilation and curettage and novasure ablation. S/P BTL  - ERAS protocol  - Consent confirmed  - To OR when ready    [name] 09/23/2022, 9:39 AM

## 2022-09-23 NOTE — Op Note (Signed)
Operative Note    Preoperative Diagnosis Dysfunctional uterine bleeding Complete family   Postoperative Diagnosis: Same   Procedure: Hysteroscopy with dilation and currettage of uterus and novasure ablation of uterus   Surgeon: Britt Bottom DO   Anesthesia: General   Fluids: LR EBL: 5ml UOP:   Findings: Grossly normal uterine cavity in follicular phase with both ostia in normal anatomic position   Specimen: Endometrial currettings    Procedure Note Patient was taken to the operating room where general anesthesia was obtained without difficulty. She was then prepped and draped in the normal sterile fashion in the dorsal lithotomy position. An appropriate time out was performed.  A speculum was then placed within the vagina and the anterior lip of the cervix grasped with a single toothed tenaculum. A cervical block was performed with 10cc of 1% plain lidocaine at 3 and 9 oclock.  The uterus was then sounded to 9.5cm and the cervical length measured at 5cm.  The hysterosscope was introduced into the cavity and the following findings noted   Grossly normal uterine cavity in follicular phase with both ostia in normal anatomic position  The hysteroscope was then removed and the Novasure device inserted. The uterine cavity width was noted to be 3.5 cm.   The device was activated with a treatment time of 96 secs. All instruments were removed from the vagina.  The tenaculum site was hemostatic.   Finally the speculum was removed from the vagina and the patient awakened and taken to the recovery room in good condition.  All counts were noted to be correct per nursing team

## 2022-09-23 NOTE — Anesthesia Preprocedure Evaluation (Addendum)
Anesthesia Evaluation  Patient identified by MRN, date of birth, ID band Patient awake    Reviewed: Allergy & Precautions, H&P , NPO status , Patient's Chart, lab work & pertinent test results  Airway Mallampati: II  TM Distance: >3 FB Neck ROM: Full    Dental no notable dental hx. (+) Teeth Intact, Dental Advisory Given   Pulmonary Current Smoker and Patient abstained from smoking.   Pulmonary exam normal breath sounds clear to auscultation       Cardiovascular negative cardio ROS Normal cardiovascular exam Rhythm:Regular Rate:Normal     Neuro/Psych  PSYCHIATRIC DISORDERS      negative neurological ROS  negative psych ROS   GI/Hepatic negative GI ROS, Neg liver ROS,,,  Endo/Other  negative endocrine ROS    Renal/GU negative Renal ROS  negative genitourinary   Musculoskeletal negative musculoskeletal ROS (+)    Abdominal   Peds negative pediatric ROS (+) ADHD Hematology negative hematology ROS (+)   Anesthesia Other Findings   Reproductive/Obstetrics negative OB ROS                             Anesthesia Physical Anesthesia Plan  ASA: 2  Anesthesia Plan: General   Post-op Pain Management: Minimal or no pain anticipated   Induction: Intravenous  PONV Risk Score and Plan: 2 and Ondansetron and Dexamethasone  Airway Management Planned: Oral ETT and LMA  Additional Equipment: None  Intra-op Plan:   Post-operative Plan: Extubation in OR  Informed Consent: I have reviewed the patients History and Physical, chart, labs and discussed the procedure including the risks, benefits and alternatives for the proposed anesthesia with the patient or authorized representative who has indicated his/her understanding and acceptance.     Dental advisory given  Plan Discussed with: CRNA and Anesthesiologist  Anesthesia Plan Comments:         Anesthesia Quick Evaluation

## 2022-09-23 NOTE — Discharge Instructions (Addendum)
Call with any concerns [phone]   No acetaminophen/Tylenol until after 3:00pm today if needed for pain.   DISCHARGE INSTRUCTIONS: HYSTEROSCOPY / ENDOMETRIAL ABLATION The following instructions have been prepared to help you care for yourself upon your return home.    Personal hygiene:  Use sanitary pads for vaginal drainage, not tampons.  Shower the day after your procedure.  NO tub baths, pools or Jacuzzis for 2-3 weeks.  Wipe front to back after using the bathroom.  Activity and limitations:  Do NOT drive or operate any equipment for 24 hours. The effects of anesthesia are still present and drowsiness may result.  Do NOT rest in bed all day.  Walking is encouraged.  Walk up and down stairs slowly.  You may resume your normal activity in one to two days or as indicated by your physician. Sexual activity: NO intercourse for at least 2 weeks after the procedure, or as indicated by your Doctor.  Diet: Eat a light meal as desired this evening. You may resume your usual diet tomorrow.  Return to Work: You may resume your work activities in one to two days or as indicated by Therapist, sports.  What to expect after your surgery: Expect to have vaginal bleeding/discharge for 2-3 days and spotting for up to 10 days. It is not unusual to have soreness for up to 1-2 weeks. You may have a slight burning sensation when you urinate for the first day. Mild cramps may continue for a couple of days. You may have a regular period in 2-6 weeks.  Call your doctor for any of the following:  Excessive vaginal bleeding or clotting, saturating and changing one pad every hour.  Inability to urinate 6 hours after discharge from hospital.  Pain not relieved by pain medication.  Fever of 100.4 F or greater.  Unusual vaginal discharge or odor.     Post Anesthesia Home Care Instructions  Activity: Get plenty of rest for the remainder of the day. A responsible individual must stay with you for 24  hours following the procedure.  For the next 24 hours, DO NOT: -Drive a car -Advertising copywriter -Drink alcoholic beverages -Take any medication unless instructed by your physician -Make any legal decisions or sign important papers.  Meals: Start with liquid foods such as gelatin or soup. Progress to regular foods as tolerated. Avoid greasy, spicy, heavy foods. If nausea and/or vomiting occur, drink only clear liquids until the nausea and/or vomiting subsides. Call your physician if vomiting continues.  Special Instructions/Symptoms: Your throat may feel dry or sore from the anesthesia or the breathing tube placed in your throat during surgery. If this causes discomfort, gargle with warm salt water. The discomfort should disappear within 24 hours.

## 2022-09-24 ENCOUNTER — Encounter (HOSPITAL_BASED_OUTPATIENT_CLINIC_OR_DEPARTMENT_OTHER): Payer: Self-pay | Admitting: Obstetrics and Gynecology

## 2022-09-24 LAB — SURGICAL PATHOLOGY
